# Patient Record
Sex: Male | Born: 1945 | Race: White | Hispanic: No | Marital: Married | Smoking: Never smoker
Health system: Southern US, Community
[De-identification: ages and names within clinical notes are randomized; demographics above are authoritative.]

## PROBLEM LIST (undated history)

## (undated) DIAGNOSIS — E785 Hyperlipidemia, unspecified: Secondary | ICD-10-CM

## (undated) DIAGNOSIS — R413 Other amnesia: Secondary | ICD-10-CM

## (undated) DIAGNOSIS — IMO0002 Reserved for concepts with insufficient information to code with codable children: Secondary | ICD-10-CM

## (undated) DIAGNOSIS — F32A Depression, unspecified: Secondary | ICD-10-CM

## (undated) DIAGNOSIS — R569 Unspecified convulsions: Secondary | ICD-10-CM

## (undated) DIAGNOSIS — M1990 Unspecified osteoarthritis, unspecified site: Secondary | ICD-10-CM

## (undated) DIAGNOSIS — R410 Disorientation, unspecified: Secondary | ICD-10-CM

## (undated) DIAGNOSIS — F419 Anxiety disorder, unspecified: Secondary | ICD-10-CM

## (undated) DIAGNOSIS — I10 Essential (primary) hypertension: Secondary | ICD-10-CM

## (undated) DIAGNOSIS — G4730 Sleep apnea, unspecified: Secondary | ICD-10-CM

## (undated) DIAGNOSIS — F329 Major depressive disorder, single episode, unspecified: Secondary | ICD-10-CM

## (undated) DIAGNOSIS — M549 Dorsalgia, unspecified: Secondary | ICD-10-CM

## (undated) HISTORY — DX: Unspecified osteoarthritis, unspecified site: M19.90

## (undated) HISTORY — DX: Depression, unspecified: F32.A

## (undated) HISTORY — DX: Unspecified convulsions: R56.9

## (undated) HISTORY — DX: Sleep apnea, unspecified: G47.30

## (undated) HISTORY — DX: Disorientation, unspecified: R41.0

## (undated) HISTORY — PX: ROTATOR CUFF REPAIR: SHX139

## (undated) HISTORY — DX: Depression, unspecified: F41.9

## (undated) HISTORY — DX: Dorsalgia, unspecified: M54.9

## (undated) HISTORY — PX: UVULOPALATOPHARYNGOPLASTY: SHX827

## (undated) HISTORY — DX: Major depressive disorder, single episode, unspecified: F32.9

## (undated) HISTORY — DX: Other amnesia: R41.3

## (undated) HISTORY — PX: LUMBAR SPINE SURGERY: SHX701

## (undated) HISTORY — DX: Essential (primary) hypertension: I10

## (undated) HISTORY — DX: Reserved for concepts with insufficient information to code with codable children: IMO0002

## (undated) HISTORY — PX: CERVICAL SPINE SURGERY: SHX589

## (undated) HISTORY — PX: APPENDECTOMY: SHX54

## (undated) HISTORY — PX: THORACIC SPINE SURGERY: SHX802

## (undated) HISTORY — DX: Hyperlipidemia, unspecified: E78.5

## (undated) HISTORY — PX: TOTAL KNEE ARTHROPLASTY: SHX125

---

## 1998-04-25 ENCOUNTER — Ambulatory Visit (HOSPITAL_COMMUNITY): Admission: RE | Admit: 1998-04-25 | Discharge: 1998-04-25 | Payer: Self-pay | Admitting: Rheumatology

## 1998-05-01 ENCOUNTER — Ambulatory Visit (HOSPITAL_BASED_OUTPATIENT_CLINIC_OR_DEPARTMENT_OTHER): Admission: RE | Admit: 1998-05-01 | Discharge: 1998-05-01 | Payer: Self-pay | Admitting: Orthopaedic Surgery

## 1998-06-27 ENCOUNTER — Ambulatory Visit (HOSPITAL_COMMUNITY): Admission: RE | Admit: 1998-06-27 | Discharge: 1998-06-27 | Payer: Self-pay | Admitting: Family Medicine

## 1998-09-07 ENCOUNTER — Encounter: Admission: RE | Admit: 1998-09-07 | Discharge: 1998-12-06 | Payer: Self-pay | Admitting: Anesthesiology

## 1998-12-04 ENCOUNTER — Encounter: Payer: Self-pay | Admitting: Internal Medicine

## 1998-12-04 ENCOUNTER — Emergency Department (HOSPITAL_COMMUNITY): Admission: EM | Admit: 1998-12-04 | Discharge: 1998-12-04 | Payer: Self-pay | Admitting: Emergency Medicine

## 1998-12-14 ENCOUNTER — Encounter: Admission: RE | Admit: 1998-12-14 | Discharge: 1999-03-08 | Payer: Self-pay | Admitting: Anesthesiology

## 1999-01-08 ENCOUNTER — Emergency Department (HOSPITAL_COMMUNITY): Admission: EM | Admit: 1999-01-08 | Discharge: 1999-01-08 | Payer: Self-pay | Admitting: Emergency Medicine

## 1999-03-08 ENCOUNTER — Encounter: Admission: RE | Admit: 1999-03-08 | Discharge: 1999-06-06 | Payer: Self-pay | Admitting: Anesthesiology

## 1999-06-06 ENCOUNTER — Encounter: Admission: RE | Admit: 1999-06-06 | Discharge: 1999-07-29 | Payer: Self-pay | Admitting: Anesthesiology

## 1999-07-30 ENCOUNTER — Encounter: Admission: RE | Admit: 1999-07-30 | Discharge: 1999-09-03 | Payer: Self-pay | Admitting: Anesthesiology

## 1999-09-04 ENCOUNTER — Encounter: Admission: RE | Admit: 1999-09-04 | Discharge: 1999-12-26 | Payer: Self-pay | Admitting: Anesthesiology

## 2000-01-23 ENCOUNTER — Encounter: Admission: RE | Admit: 2000-01-23 | Discharge: 2000-04-22 | Payer: Self-pay | Admitting: Anesthesiology

## 2000-05-15 ENCOUNTER — Encounter: Admission: RE | Admit: 2000-05-15 | Discharge: 2000-08-13 | Payer: Self-pay | Admitting: Anesthesiology

## 2000-06-01 ENCOUNTER — Ambulatory Visit (HOSPITAL_COMMUNITY): Admission: RE | Admit: 2000-06-01 | Discharge: 2000-06-01 | Payer: Self-pay | Admitting: *Deleted

## 2000-06-03 ENCOUNTER — Ambulatory Visit (HOSPITAL_COMMUNITY): Admission: RE | Admit: 2000-06-03 | Discharge: 2000-06-03 | Payer: Self-pay | Admitting: *Deleted

## 2000-08-17 ENCOUNTER — Encounter: Admission: RE | Admit: 2000-08-17 | Discharge: 2000-11-15 | Payer: Self-pay | Admitting: Anesthesiology

## 2000-09-18 ENCOUNTER — Ambulatory Visit (HOSPITAL_COMMUNITY): Admission: RE | Admit: 2000-09-18 | Discharge: 2000-09-18 | Payer: Self-pay | Admitting: Neurological Surgery

## 2000-09-18 ENCOUNTER — Encounter: Payer: Self-pay | Admitting: Neurological Surgery

## 2000-10-27 ENCOUNTER — Encounter: Payer: Self-pay | Admitting: Neurological Surgery

## 2000-10-29 ENCOUNTER — Encounter: Payer: Self-pay | Admitting: Neurological Surgery

## 2000-10-29 ENCOUNTER — Inpatient Hospital Stay (HOSPITAL_COMMUNITY): Admission: RE | Admit: 2000-10-29 | Discharge: 2000-10-30 | Payer: Self-pay | Admitting: Neurological Surgery

## 2000-11-03 ENCOUNTER — Encounter: Payer: Self-pay | Admitting: Neurological Surgery

## 2000-11-03 ENCOUNTER — Encounter: Admission: RE | Admit: 2000-11-03 | Discharge: 2000-11-03 | Payer: Self-pay | Admitting: Neurological Surgery

## 2000-11-27 ENCOUNTER — Encounter: Payer: Self-pay | Admitting: Neurological Surgery

## 2000-11-27 ENCOUNTER — Encounter: Admission: RE | Admit: 2000-11-27 | Discharge: 2000-11-27 | Payer: Self-pay | Admitting: Neurological Surgery

## 2000-12-18 ENCOUNTER — Encounter: Admission: RE | Admit: 2000-12-18 | Discharge: 2001-03-18 | Payer: Self-pay | Admitting: Anesthesiology

## 2001-03-17 ENCOUNTER — Encounter: Admission: RE | Admit: 2001-03-17 | Discharge: 2001-06-15 | Payer: Self-pay | Admitting: Anesthesiology

## 2001-04-20 ENCOUNTER — Encounter: Admission: RE | Admit: 2001-04-20 | Discharge: 2001-04-20 | Payer: Self-pay | Admitting: Neurological Surgery

## 2001-04-20 ENCOUNTER — Encounter: Payer: Self-pay | Admitting: Neurological Surgery

## 2001-05-13 ENCOUNTER — Inpatient Hospital Stay (HOSPITAL_COMMUNITY): Admission: RE | Admit: 2001-05-13 | Discharge: 2001-05-14 | Payer: Self-pay | Admitting: Neurological Surgery

## 2001-05-13 ENCOUNTER — Encounter: Payer: Self-pay | Admitting: Neurological Surgery

## 2001-06-04 ENCOUNTER — Encounter: Payer: Self-pay | Admitting: Neurological Surgery

## 2001-06-04 ENCOUNTER — Encounter: Admission: RE | Admit: 2001-06-04 | Discharge: 2001-06-04 | Payer: Self-pay | Admitting: Neurological Surgery

## 2001-07-15 ENCOUNTER — Encounter: Admission: RE | Admit: 2001-07-15 | Discharge: 2001-07-28 | Payer: Self-pay | Admitting: Anesthesiology

## 2001-07-16 ENCOUNTER — Encounter: Payer: Self-pay | Admitting: Neurological Surgery

## 2001-07-16 ENCOUNTER — Encounter: Admission: RE | Admit: 2001-07-16 | Discharge: 2001-07-16 | Payer: Self-pay | Admitting: Neurological Surgery

## 2001-09-09 ENCOUNTER — Encounter: Payer: Self-pay | Admitting: Family Medicine

## 2001-09-09 ENCOUNTER — Ambulatory Visit (HOSPITAL_COMMUNITY): Admission: RE | Admit: 2001-09-09 | Discharge: 2001-09-09 | Payer: Self-pay | Admitting: Family Medicine

## 2001-10-15 ENCOUNTER — Encounter: Admission: RE | Admit: 2001-10-15 | Discharge: 2001-10-15 | Payer: Self-pay | Admitting: Neurological Surgery

## 2001-10-15 ENCOUNTER — Encounter: Payer: Self-pay | Admitting: Neurological Surgery

## 2001-10-24 ENCOUNTER — Emergency Department (HOSPITAL_COMMUNITY): Admission: EM | Admit: 2001-10-24 | Discharge: 2001-10-24 | Payer: Self-pay

## 2002-05-07 ENCOUNTER — Emergency Department (HOSPITAL_COMMUNITY): Admission: EM | Admit: 2002-05-07 | Discharge: 2002-05-07 | Payer: Self-pay | Admitting: Emergency Medicine

## 2002-05-07 ENCOUNTER — Encounter: Payer: Self-pay | Admitting: Emergency Medicine

## 2002-09-07 ENCOUNTER — Encounter: Payer: Self-pay | Admitting: Emergency Medicine

## 2002-09-07 ENCOUNTER — Inpatient Hospital Stay (HOSPITAL_COMMUNITY): Admission: EM | Admit: 2002-09-07 | Discharge: 2002-09-09 | Payer: Self-pay | Admitting: Emergency Medicine

## 2003-06-26 ENCOUNTER — Ambulatory Visit (HOSPITAL_BASED_OUTPATIENT_CLINIC_OR_DEPARTMENT_OTHER): Admission: RE | Admit: 2003-06-26 | Discharge: 2003-06-26 | Payer: Self-pay | Admitting: Internal Medicine

## 2004-01-08 ENCOUNTER — Ambulatory Visit (HOSPITAL_COMMUNITY): Admission: RE | Admit: 2004-01-08 | Discharge: 2004-01-08 | Payer: Self-pay | Admitting: Anesthesiology

## 2004-01-16 ENCOUNTER — Ambulatory Visit (HOSPITAL_BASED_OUTPATIENT_CLINIC_OR_DEPARTMENT_OTHER): Admission: RE | Admit: 2004-01-16 | Discharge: 2004-01-16 | Payer: Self-pay | Admitting: Orthopaedic Surgery

## 2004-01-16 ENCOUNTER — Ambulatory Visit (HOSPITAL_COMMUNITY): Admission: RE | Admit: 2004-01-16 | Discharge: 2004-01-16 | Payer: Self-pay | Admitting: Orthopaedic Surgery

## 2004-01-19 ENCOUNTER — Emergency Department (HOSPITAL_COMMUNITY): Admission: EM | Admit: 2004-01-19 | Discharge: 2004-01-20 | Payer: Self-pay | Admitting: Emergency Medicine

## 2004-02-18 ENCOUNTER — Inpatient Hospital Stay (HOSPITAL_COMMUNITY): Admission: EM | Admit: 2004-02-18 | Discharge: 2004-02-24 | Payer: Self-pay | Admitting: Emergency Medicine

## 2004-04-12 ENCOUNTER — Encounter: Admission: RE | Admit: 2004-04-12 | Discharge: 2004-04-12 | Payer: Self-pay | Admitting: Neurological Surgery

## 2004-07-19 ENCOUNTER — Encounter: Admission: RE | Admit: 2004-07-19 | Discharge: 2004-07-19 | Payer: Self-pay | Admitting: Family Medicine

## 2004-09-13 ENCOUNTER — Ambulatory Visit (HOSPITAL_COMMUNITY): Admission: RE | Admit: 2004-09-13 | Discharge: 2004-09-13 | Payer: Self-pay | Admitting: Gastroenterology

## 2004-10-31 ENCOUNTER — Inpatient Hospital Stay (HOSPITAL_COMMUNITY): Admission: RE | Admit: 2004-10-31 | Discharge: 2004-11-04 | Payer: Self-pay | Admitting: Orthopaedic Surgery

## 2005-02-20 ENCOUNTER — Ambulatory Visit: Payer: Self-pay | Admitting: Internal Medicine

## 2006-02-17 ENCOUNTER — Ambulatory Visit: Payer: Self-pay | Admitting: Internal Medicine

## 2007-04-27 ENCOUNTER — Ambulatory Visit (HOSPITAL_COMMUNITY): Admission: RE | Admit: 2007-04-27 | Discharge: 2007-04-28 | Payer: Self-pay | Admitting: Orthopaedic Surgery

## 2007-09-10 ENCOUNTER — Ambulatory Visit: Payer: Self-pay | Admitting: Internal Medicine

## 2008-01-10 ENCOUNTER — Encounter: Payer: Self-pay | Admitting: Internal Medicine

## 2008-01-10 DIAGNOSIS — J309 Allergic rhinitis, unspecified: Secondary | ICD-10-CM | POA: Insufficient documentation

## 2008-01-10 DIAGNOSIS — G4733 Obstructive sleep apnea (adult) (pediatric): Secondary | ICD-10-CM

## 2008-01-10 DIAGNOSIS — S022XXA Fracture of nasal bones, initial encounter for closed fracture: Secondary | ICD-10-CM

## 2008-01-10 DIAGNOSIS — IMO0002 Reserved for concepts with insufficient information to code with codable children: Secondary | ICD-10-CM

## 2008-04-13 ENCOUNTER — Encounter: Admission: RE | Admit: 2008-04-13 | Discharge: 2008-04-13 | Payer: Self-pay | Admitting: Orthopedic Surgery

## 2008-06-22 ENCOUNTER — Inpatient Hospital Stay (HOSPITAL_COMMUNITY): Admission: RE | Admit: 2008-06-22 | Discharge: 2008-06-23 | Payer: Self-pay | Admitting: Neurological Surgery

## 2008-08-22 ENCOUNTER — Ambulatory Visit: Payer: Self-pay | Admitting: Internal Medicine

## 2008-08-22 DIAGNOSIS — Z77090 Contact with and (suspected) exposure to asbestos: Secondary | ICD-10-CM

## 2009-01-11 ENCOUNTER — Ambulatory Visit: Payer: Self-pay | Admitting: Internal Medicine

## 2009-03-07 ENCOUNTER — Ambulatory Visit (HOSPITAL_COMMUNITY): Admission: RE | Admit: 2009-03-07 | Discharge: 2009-03-07 | Payer: Self-pay | Admitting: Neurological Surgery

## 2009-07-19 ENCOUNTER — Ambulatory Visit: Payer: Self-pay | Admitting: Internal Medicine

## 2009-09-21 ENCOUNTER — Ambulatory Visit: Payer: Self-pay | Admitting: Internal Medicine

## 2009-10-10 ENCOUNTER — Emergency Department (HOSPITAL_COMMUNITY): Admission: EM | Admit: 2009-10-10 | Discharge: 2009-10-10 | Payer: Self-pay | Admitting: Emergency Medicine

## 2009-10-14 ENCOUNTER — Emergency Department (HOSPITAL_COMMUNITY): Admission: EM | Admit: 2009-10-14 | Discharge: 2009-10-14 | Payer: Self-pay | Admitting: Family Medicine

## 2010-04-26 ENCOUNTER — Ambulatory Visit: Payer: Self-pay | Admitting: Internal Medicine

## 2010-10-22 ENCOUNTER — Ambulatory Visit: Payer: Self-pay | Admitting: Internal Medicine

## 2011-01-28 NOTE — Assessment & Plan Note (Signed)
Summary: rov/apc   Primary Provider/Referring Provider:  Arvilla Market  CC:  follow up visit-wheezing and SOB; headache from allergies. .  History of Present Illness: 08/22/08- 65 year old man returning for follow-up of allergic rhinitis and obstructive sleep apnea.  Wife  here to help with the history.  He had cervical spine surgery with rods in April of 2009.  More comfortable now.  He has not been using CPAP.  Nose gets congested.  Occasional wheezing.  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  01/11/09-Allergic rhinitis, OSA, Hx asbestos exposure Nasal stuffiness - indoor heat. Denies sustained cough, chest pain, phlegm. Feet swell some. Runs humidifier. Gets dry skin- asks about lotion. Proair sample helped some.   07/18/09- Allergic rhinitis, OSA/ UPPP, Hx asbestos exposure.............................Marland Kitchenwife here Wife uses cpap but he failed- never tolerated. She says his breathing is moaning, heavy, more labored over past several months.He notes more wheeze, little cough. Uses Proair a bout twice/ week.  10-13-09- Allergic rihinitis, OSA/ UPPP, hx asbestos exposure Palate itching, nasal congestion past month or two. Remote hx of allergy vacine- couldn't afford then. Review PFT- WNL CXR-  Stable. Borderline CE. No apparent increase in small densities.   April 26, 2010- Allergic rhinitis, OSA/UPPP, hx asbestos exposure Increased wheeze and nasal congestion over the past few weeks. Having to sit up to breathe due to nasal congestion. Used up Nasonex but can't afford it and says it didn't seem to help. Having occipital headache with photophobia. Sleeps with cervical pilow. Failed an otc oral appliance. CPAP is too confining -"claustrophobia" so he can't tolerate it. Uses Neti pot. Wheezing and feels shallow in chest. He asks if this is related to his remote asbestos exposure.  PFT was normal last year. CXR last year showed peribvonchial  thickening but no obvious asbestos changes.  Current Medications (verified): 1)  Triazolam 0.25 Mg Tabs (Triazolam) .... Take 1 Tablet By Mouth At Bedtime 2)  Gabapentin 600 Mg  Tabs (Gabapentin) 3)  Xanax 0.5 Mg  Tabs (Alprazolam) .... Take 1 By Mouth Once Daily 4)  Lexapro 20 Mg  Tabs (Escitalopram Oxalate) 5)  Trileptal 300 Mg  Tabs (Oxcarbazepine) .... Take 1 By Mouth Two Times A Day 6)  Simvastatin 20 Mg  Tabs (Simvastatin) .... Take 1 By Mouth Once Daily 7)  Bl L-Lysine 500 Mg  Tabs (Lysine) 8)  Glucosamine 500 Mg  Caps (Glucosamine Sulfate) 9)  Multivitamins   Tabs (Multiple Vitamin) .... Take 1 By Mouth Once Daily 10)  Iron 11)  Hydrocodone-Acetaminophen 10-325 Mg  Tabs (Hydrocodone-Acetaminophen) .... As Needed 12)  Oxycontin 20 Mg Xr12h-Tab (Oxycodone Hcl) .... Take As Directed 13)  Proair Hfa 108 (90 Base) Mcg/act Aers (Albuterol Sulfate) .... 2 Puffs Four Times A Day Prn 14)  Nasonex 50 Mcg/act Susp (Mometasone Furoate) .Marland Kitchen.. 1-2 Sprays Each Nostril Daily  Allergies (verified): No Known Drug Allergies  Past History:  Past Medical History: Last updated: 07/19/2009 Allergic Rhinitis Degenerative disk disease Obstructive sleep apnea- failed cpap  Past Surgical History: Last updated: 01/11/2009 Cervical spine- rods, 3 other fusion procedures Bilateral rotator cuffs Multiple Thoracic and Lumbar spine surgeries Appendix UPPP and SMR nasal turbinates. Right TKR  Family History: Last updated: 10/13/09 Parents both died of ETOH Family hx- Arthritis Clotting disorders- nephew Mother-deceased age 56's Father-deceased age 35's Sibling 1- living age 20 Sibling 2- living age 90 Sibling 3- living age 35 Sibling 91- living age 19  Social History: Last updated: 08/28/2008  Patient never smoked.  Cut asbestos x 2 months around 1985. Positive history of passive tobacco smoke exposure.  Exercise-no Caffeine-5 cups daily Quit using Drugs- 1990 Quit  Drinking-1990 Married with no children   Risk Factors: Smoking Status: never (08/22/2008) Passive Smoke Exposure: yes (08/28/2008)  Review of Systems      See HPI       The patient complains of headaches.  The patient denies anorexia, fever, weight loss, weight gain, vision loss, decreased hearing, hoarseness, chest pain, syncope, dyspnea on exertion, peripheral edema, prolonged cough, hemoptysis, and severe indigestion/heartburn.         Occipiital headaches related to prior surgery.  Vital Signs:  Patient profile:   65 year old male Height:      70 inches Weight:      285.38 pounds BMI:     41.10 O2 Sat:      96 % on Room air Pulse rate:   76 / minute BP sitting:   140 / 70  (right arm) Cuff size:   large  Vitals Entered By: Reynaldo Minium CMA (April 26, 2010 2:08 PM)  O2 Flow:  Room air  Physical Exam  Additional Exam:  General: A/Ox3; pleasant and cooperative, NAD, obese SKIN: no rash, lesions, dry NODES: no lymphadenopathy HEENT: [place]/AT, EOM- WNL, Conjuctivae- clear, PERRLA, TM-WNL, Nose- turbinate edema, Throat- s/p UPPP NECK: Supple w/ fair ROM, JVD- none, normal carotid impulses w/o bruits Thyroid- normal to palpation CHEST: Clear to P&A HEART: RRR, no m/g/r heard, no rales or crackles ABDOMEN: Soft and nl; ZOX:WRUE, nl pulses, no edema  NEURO: Grossly intact to observation      Impression & Recommendations:  Problem # 1:  OBSTRUCTIVE SLEEP APNEA (ICD-327.23) Intolerant or unsuccessful with therapies. Weight loss would be help.  Problem # 2:  HISTORY OF ASBESTOS EXPOSURE (ICD-V15.84) PFT and CXR don't show obvious asbestos changes  He would like ventolin HFA for trial  Problem # 3:  ALLERGIC RHINITIS (ICD-477.9)  We will try neb and depo for effect. Change to fluticasone if he can afford it. His updated medication list for this problem includes:    Fluticasone Propionate 50 Mcg/act Susp (Fluticasone propionate) .Marland Kitchen... 2 sprays each nostril once daily  at bedtime  Orders: EMR miscellaneous medications (EMRORAL) Nebulizer Tx (45409) Depo- Medrol 80mg  (J1040) Admin of Therapeutic Inj  intramuscular or subcutaneous (81191)  Medications Added to Medication List This Visit: 1)  Fluticasone Propionate 50 Mcg/act Susp (Fluticasone propionate) .... 2 sprays each nostril once daily at bedtime 2)  Ventolin Hfa 108 (90 Base) Mcg/act Aers (Albuterol sulfate) .... 2 puffs four times a day as needed rescue inhaler  Patient Instructions: 1)  Please schedule a follow-up appointment in 6 months. 2)  neb neo nasal 3)  depo 80 4)  Script for fluticasone to try instead of nasonex 5)  script for Ventolin rescue inhaler to try instead of Proair Prescriptions: VENTOLIN HFA 108 (90 BASE) MCG/ACT AERS (ALBUTEROL SULFATE) 2 puffs four times a day as needed rescue inhaler  #1 x prn   Entered and Authorized by:   Waymon Budge MD   Signed by:   Waymon Budge MD on 04/26/2010   Method used:   Print then Give to Patient   RxID:   [id number] FLUTICASONE PROPIONATE 50 MCG/ACT SUSP (FLUTICASONE PROPIONATE) 2 sprays each nostril once daily at bedtime  #1 x prn   Entered and Authorized by:   Waymon Budge MD   Signed by:  Waymon Budge MD on 04/26/2010   Method used:   Print then Give to Patient   RxID:   [id number]      Medication Administration  Injection # 1:    Medication: Depo- Medrol 80mg     Diagnosis: ALLERGIC RHINITIS (ICD-477.9)    Route: IM    Site: LUOQ gluteus    Exp Date: 12/2010    Lot #: [id number]    Mfr: Pharmacia    Patient tolerated injection without complications    Given by: Zackery Barefoot CMA (April 26, 2010 2:59 PM)  Medication # 1:    Medication: EMR miscellaneous medications    Diagnosis: ALLERGIC RHINITIS (ICD-477.9)    Dose: 3 drops    Route: inhaled    Exp Date: 05/12    Lot #: [id number]    Mfr: Bayer    Patient tolerated medication without complications    Given by: Zackery Barefoot CMA (April 26, 2010 2:58 PM)  Orders Added: 1)  EMR miscellaneous medications [EMRORAL] 2)  Nebulizer Tx [62130] 3)  Depo- Medrol 80mg  [J1040] 4)  Admin of Therapeutic Inj  intramuscular or subcutaneous [86578]

## 2011-01-28 NOTE — Assessment & Plan Note (Signed)
Summary: 6 months/ mbw   Primary Provider/Referring Provider:  Arvilla Market  CC:  6 month follow up, c/o increase allergy symptons, nasal congestion, headaches, and prod cough yellow.  History of Present Illness:  09/23/2009- Allergic rihinitis, OSA/ UPPP, hx asbestos exposure Palate itching, nasal congestion past month or two. Remote hx of allergy vacine- couldn't afford then. Review PFT- WNL CXR-  Stable. Borderline CE. No apparent increase in small densities.  April 26, 2010- Allergic rhinitis, OSA/UPPP, hx asbestos exposure Increased wheeze and nasal congestion over the past few weeks. Having to sit up to breathe due to nasal congestion. Used up Nasonex but can't afford it and says it didn't seem to help. Having occipital headache with photophobia. Sleeps with cervical pilow. Failed an otc oral appliance. CPAP is too confining -"claustrophobia" so he can't tolerate it. Uses Neti pot. Wheezing and feels shallow in chest. He asks if this is related to his remote asbestos exposure.  PFT was normal last year. CXR last year showed peribvonchial thickening but no obvious asbestos changes.  October 22, 2010- Allergic rhinitis, OSA/UPPP, hx asbestos exposure Nurse-CC: 6 month follow up, c/o increase allergy symptoms, nasal congestion, headaches, prod cough yellow Tries to use Neti pot, but it doesn't help much. He is using Afrin or Dristan about 3x/ daily although I have counseled against it. This was discussed agian. He "panics" if he can't breathe. Ventolin works well , used about 3x/ week  Declines flu vax- says it made him sick last year.     Preventive Screening-Counseling & Management  Alcohol-Tobacco     Smoking Status: never  Current Medications (verified): 1)  Gabapentin 600 Mg  Tabs (Gabapentin) 2)  Xanax 0.5 Mg  Tabs (Alprazolam) .... Take 1 By Mouth Once Daily 3)  Lexapro 20 Mg  Tabs (Escitalopram Oxalate) 4)  Trileptal 300 Mg  Tabs (Oxcarbazepine) .... Take 1 By Mouth Two Times  A Day 5)  Simvastatin 20 Mg  Tabs (Simvastatin) .... Take 1 By Mouth Once Daily 6)  Bl L-Lysine 500 Mg  Tabs (Lysine) 7)  Glucosamine 500 Mg  Caps (Glucosamine Sulfate) 8)  Multivitamins   Tabs (Multiple Vitamin) .... Take 1 By Mouth Once Daily 9)  Iron 10)  Hydrocodone-Acetaminophen 10-325 Mg  Tabs (Hydrocodone-Acetaminophen) .... As Needed 11)  Oxycontin 20 Mg Xr12h-Tab (Oxycodone Hcl) .... Take As Directed 12)  Fluticasone Propionate 50 Mcg/act Susp (Fluticasone Propionate) .... 2 Sprays Each Nostril Once Daily At Bedtime 13)  Ventolin Hfa 108 (90 Base) Mcg/act Aers (Albuterol Sulfate) .... 2 Puffs Four Times A Day As Needed Rescue Inhaler  Allergies (verified): No Known Drug Allergies  Past History:  Past Medical History: Last updated: 07/19/2009 Allergic Rhinitis Degenerative disk disease Obstructive sleep apnea- failed cpap  Past Surgical History: Last updated: 01/11/2009 Cervical spine- rods, 3 other fusion procedures Bilateral rotator cuffs Multiple Thoracic and Lumbar spine surgeries Appendix UPPP and SMR nasal turbinates. Right TKR  Family History: Last updated: Sep 23, 2009 Parents both died of ETOH Family hx- Arthritis Clotting disorders- nephew Mother-deceased age 73's Father-deceased age 48's Sibling 1- living age 47 Sibling 2- living age 53 Sibling 3- living age 63 Sibling 69- living age 67  Social History: Last updated: 08/28/2008 Patient never smoked.  Cut asbestos x 2 months around 1985. Positive history of passive tobacco smoke exposure.  Exercise-no Caffeine-5 cups daily Quit using Drugs- 1990 Quit Drinking-1990 Married with no children   Risk Factors: Smoking Status: never (10/22/2010) Passive Smoke Exposure: yes (08/28/2008)  Review of Systems  See HPI       The patient complains of nasal congestion/difficulty breathing through nose.  The patient denies shortness of breath with activity, shortness of breath at rest, productive cough,  non-productive cough, coughing up blood, chest pain, irregular heartbeats, acid heartburn, indigestion, loss of appetite, weight change, abdominal pain, difficulty swallowing, sore throat, tooth/dental problems, headaches, and sneezing.    Vital Signs:  Patient profile:   65 year old male Height:      69 inches Weight:      272.4 pounds BMI:     40.37 O2 Sat:      95 % on Room air Pulse rate:   64 / minute BP sitting:   148 / 70  (left arm) Cuff size:   large  Vitals Entered By: Renold Genta RCP, LPN (October 22, 2010 10:58 AM)  O2 Flow:  Room air CC: 6 month follow up, c/o increase allergy symptons, nasal congestion, headaches, prod cough yellow Is Patient Diabetic? No Comments Medications reviewed with patient Renold Genta RCP, LPN  October 22, 2010 10:58 AM    Physical Exam  Additional Exam:  General: A/Ox3; pleasant and cooperative, NAD, obese SKIN: no rash, lesions, dry NODES: no lymphadenopathy HEENT: [place]/AT, EOM- WNL, Conjuctivae- clear, PERRLA, TM-WNL, Nose- turbinate edema, Throat- s/p UPPP, not red, no drainage NECK: Supple w/ fair ROM, JVD- none, normal carotid impulses w/o bruits Thyroid- normal to palpation CHEST: Clear to P&A, no rales or crackles HEART: RRR, no m/g/r heard, ABDOMEN: Soft and nl; ZOX:WRUE, nl pulses, no edema  NEURO: Grossly intact to observation      Impression & Recommendations:  Problem # 1:  ALLERGIC RHINITIS (ICD-477.9)  He is getting in risk of rhinitis medicamentosa. We discussed this very carefully. I will have him try an antihistamine nasal spray.   His updated medication list for this problem includes:    Fluticasone Propionate 50 Mcg/act Susp (Fluticasone propionate) .Marland Kitchen... 2 sprays each nostril once daily at bedtime    Afrin Nasal Spray 0.05 % Soln (Oxymetazoline hcl) .Marland Kitchen... 2 sprays each nostri three times a day  Problem # 2:  OBSTRUCTIVE SLEEP APNEA (ICD-327.23)  s/p UPPP. This seems to be controlling him.  Problem #  3:  HISTORY OF ASBESTOS EXPOSURE (ICD-V15.84) We have educated on long term concerns, mainly some increased risk of cancer.  Medications Added to Medication List This Visit: 1)  Afrin Nasal Spray 0.05 % Soln (Oxymetazoline hcl) .... 2 sprays each nostri three times a day  Other Orders: Est. Patient Level III (45409)  Patient Instructions: 1)  Please schedule a follow-up appointment in 6 months. 2)  Try sample Astepro nasal antihistamine spray 3)   1-2 puffs in each nostril twice daily if needed.

## 2011-04-21 ENCOUNTER — Encounter: Payer: Self-pay | Admitting: Internal Medicine

## 2011-04-22 ENCOUNTER — Ambulatory Visit
Admission: RE | Admit: 2011-04-22 | Discharge: 2011-04-22 | Disposition: A | Discharge status: Home / Self Care | Payer: Medicare Other | Source: Ambulatory Visit | Attending: Anesthesiology | Admitting: Anesthesiology

## 2011-04-22 ENCOUNTER — Other Ambulatory Visit: Payer: Self-pay | Admitting: Anesthesiology

## 2011-04-22 DIAGNOSIS — M545 Low back pain: Secondary | ICD-10-CM

## 2011-04-23 ENCOUNTER — Ambulatory Visit: Payer: Self-pay | Admitting: Internal Medicine

## 2011-05-13 NOTE — Discharge Summary (Signed)
NAME:  [name], [name] NO.:  [id number]   MEDICAL RECORD NO.:  [id number]          PATIENT TYPE:  INP   LOCATION:  3109                         FACILITY:  [facility]   PHYSICIAN:  Stefani Dama, M.D.  DATE OF BIRTH:  [birth year]   DATE OF ADMISSION:  06/22/2008  DATE OF DISCHARGE:  06/23/2008                               DISCHARGE SUMMARY   ADMITTING DIAGNOSES:  Cervical spondylosis with pseudoarthrosis C3-C4,  C5-C6, cervical radiculopathy C6-C7.   DISCHARGE AND FINAL DIAGNOSES:  Cervical spondylosis with  pseudoarthrosis C3-C4, C5-C6, cervical radiculopathy C6-C7.   MAJOR OPERATIONS:  Decompression of C6-C7 bilaterally, segmental  fixation C3 to C7 with posterolateral arthrodesis using allograft, and  infused on June 22, 2008.   CONDITION ON DISCHARGE:  Improving.   HOSPITAL COURSE:  [name] is a 64 year old individual who was had  significant problems with neck, shoulder, and arm pain.  He has evidence  of  pseudoarthrosis at C3-C4 and C5-C6 with a solid fusion at C4-C5.  He  has evidence of advanced spondylitic changes at C6-C7 with foraminal  stenosis.  He has been advised regarding need for surgery to decompress  C6-C7 and stabilize posteriorly from C3 to C7.  He was taken to the  operating room where this procedure was performed, and postoperatively  he has done well, tolerating oral pain medications.  He is currently on  OxyContin 20 mg three times a day in addition to Norco 10 mg q.6 h. as  needed for pain.  He is discharged to home on these medications in  addition to Valium for muscle spasm.  He will be seen in the office in  the next 2 or 3 days for postoperative care.   CONDITION ON DISCHARGE:  Improved.      Stefani Dama, M.D.  Electronically Signed     HJE/MEDQ  D:  06/23/2008  T:  06/24/2008  Job:  [id number]

## 2011-05-13 NOTE — Op Note (Signed)
NAME:  [name] NO.:  [id number]   MEDICAL RECORD NO.:  [id number]          PATIENT TYPE:  INP   LOCATION:  3109                         FACILITY:  [facility]   PHYSICIAN:  [name], M.D.  DATE OF BIRTH:  22-Aug-1946   DATE OF PROCEDURE:  DATE OF DISCHARGE:                               OPERATIVE REPORT   PREOPERATIVE DIAGNOSIS:  Pseudoarthrosis C3-C4, C5-C6 spondylosis, C6-C7  with cervical radiculopathy.   POSTOPERATIVE DIAGNOSIS:  Pseudoarthrosis C3-C4, C5-C6 spondylosis, C6-  C7 with cervical radiculopathy.   PROCEDURE:  Posterior decompression C6-C7, decompression of C7 nerve  root and common dural tube, segmental fixation C3-C7 with facet screws  and rod and posterior and posterolateral arthrodesis using allograft.   SURGEON:  [name], M.D.   FIRST ASSISTANT:  Clydene Fake, M.D.   ANESTHESIA:  General endotracheal.   INDICATIONS:  [name] is a 65 year old individual who has had  significant problems with neck pain, shoulder, and arm pain.  He has  some ulnar dysesthesias and has evidence of spondylosis at the C6-C7  level.  He is advised regarding the need for surgical decompression via  posterior approach secondary to his previously failed anterior fusions  at C3-C4 and C5-C6.   PROCEDURE:  The patient was brought to the operating room supine on the  stretcher.  After smooth induction of general endotracheal anesthesia,  he was placed in the three-pin headrest, then turned prone carefully.  Neck was supported in the three-pin headrest and the back of the neck  was then prepped with alcohol and DuraPrep and draped in a sterile  fashion.  A midline incision was created and carried down to the  cervical dorsal fascia which was opened on either side of midline to  expose the spinous processes from C2 down to T1.  Dissection was then  carried out in subperiosteal fashion out to the lateral gutters with a  self-retaining  McCullough retractors being placed deep in the wound at  C6-C7, then bilateral laminotomies were created with foraminotomies to  expose the C7 nerve roots.  Path of the C7 nerve roots were then  skeletonized out into the foramen, these were decompressed.  Significant  redundant thickened ligamentous material was present over the  laminotomies and once the path of the C7 nerve root was identified and  cleared, the central canal portion was then cleared similarly.  Hemostasis in the soft tissues was achieved and then the transfacet  screws were chosen at C3, C4, C5, C6, and C7.  On the left side then,  facet screws measuring 14 mm in length and 4 mm in diameter were placed  into the facets using standard Roy-Camille technique.  On the right  side, the facet screws replaced in C3, C4, C6, and C7.  C5 initially had  a hole placed, but the screw was noted to strip out laterally, and for  that reason, the screw was not placed there.  Then, an 80-mm rod was cut  and contoured to fit between the screw heads.  It was tightened in place  in a neutral  position.  The screw caps being applied.  System was  torqued down in a neutral position.   Finally, we obtained radiographs in the AP and lateral projections.  Radiology was difficult to obtain adequately secondary to this patient's  robust shoulders and thick neck.  In any event, visual exploration was  undertaken to make sure that there was no cutout of the screws and each  screw was sounded individually after the hole was tapped and no cutout  was noted.  With the hardware being in place, the lateral gutters and  the medial laminar arches were decorticated using a high-speed drill and  strips of Infuse were laid between C3 and C7 along with allograft in the  form of Vitoss bone sponge that was cut into 4 long strips, with the  strip each being placed into the lateral gutter and to the interlaminar  space on each side.  With this, cervical dorsal  fascia was  reapproximated and closed with #1 Vicryl interrupted fashion, 2-0 Vicryl  was used in subcutaneous tissues, 3-0 Vicryl was used subcuticularly and  surgical staples were used on the skin.  Total blood loss for the  procedure was estimated at approximately 600 mL.  The patient tolerated  procedure well and was returned to recovery room in stable condition.      [name], M.D.  Electronically Signed     HJE/MEDQ  D:  06/22/2008  T:  06/23/2008  Job:  [id number]

## 2011-05-13 NOTE — Assessment & Plan Note (Signed)
[facility]                             PULMONARY OFFICE NOTE   [name], [name]                      MRN:          [id number]  DATE:09/10/2007                            DOB:          16-Feb-1946    PROBLEM:  1. Obstructive sleep apnea/palatoplasty.  2. Allergic rhinitis.   HISTORY:  [name] had dropped off of CPAP quite some time ago with a more  distant history of palatoplasty for his sleep apnea.  Wife indicates [name]  is snoring again as [name] has gained weight.  Bedtime is around 11 p.m., up  at 6 a.m., then [name] will go back to bed often until 8 a.m. after [name] has  gotten his wife off to work.  [name] feels some difficulty taking a deep  breath, but denies wheeze or cough.  Shoulder pain wakes him frequently.  Dr. Vear Clock is having him try Xanax 0.5 mg which may contribute to  daytime somnolence.  [name] has a history of nasal fracture.   MEDICATIONS:  1. Gabapentin 600 mg .  2. Xanax 0.5 mg b.i.d.  3. Lexapro 20 mg.  4. Trileptal 300 mg b.i.d.  5. Simvastatin 20 mg.  6. L-lycine.  7. Glucosamine.  8. Multivitamins.  9. Iron.  10.Hydrocodone 10/325 q.i.d.   ALLERGIES:  No medication allergy.   OBJECTIVE:  Weight 267 pounds, blood pressure 146/72, pulse 66, room air  saturation 98%.  [name] seems alert now.  [name] can breathe comfortably through his nose with  mouth closed.  NECK:  There is no stridor or neck vein distention.  CHEST:  Clear.  Pulse regular, no tremor.   IMPRESSION:  1. Obstructive sleep apnea, now decompensating if [name] gains weight.  2. History of nasal fracture.  3. Behavioral health problems with use of sedating medications  4. Sleep disturbed by somatic pain related to his shoulder.   PLAN:  1. Weight loss which is the main problem decompensating his sleep      apnea.  2. Stop triazolam since [name] is taking of the sedating medicines.  3. Schedule return four months.  If [name] has not managed to lose weight      and see significant   improvement we may need to repeat a sleep study.  4. We discussed good sleep hygiene and reasonable expectations.     Clinton D. Maple Hudson, MD, Tonny Bollman, FACP  Electronically Signed    CDY/MedQ  DD: 09/12/2007  DT: 09/13/2007  Job #: [id number]   cc:   Donia Guiles, M.D.  Kathrin Penner. Vear Clock, M.D.

## 2011-05-16 NOTE — Op Note (Signed)
[place]  Patient:    [name], [name]                      MRN: [id number] Proc. Date: 10/29/00 Adm. 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  OPERATION:  Anterior cervical diskectomy and arthrodesis, C3-4; Synthes fixation, structural allograft.  SURGEON:  Stefani Dama, M.D.  FIRST ASSISTANT:  Tanya Nones. Jeral Fruit, M.D.  ANESTHESIA:  General endotracheal.  INDICATIONS:  The patient is a 65 year old individual who has had significant neck, shoulder, bilateral arm pain with headaches.  He has had previous spondylitic disease in the cervical spine but now has demonstrated cord compression at the C3-4 level with myopathic changes.  He was advised regarding surgical decompression and stabilization at the C3-4 level via an anterior diskectomy.  DESCRIPTION OF PROCEDURE:  The patient was brought to the operating room and placed on the table in the supine position.  After the smooth induction of general endotracheal anesthesia, 5 pounds of Holter traction was placed.  The neck was prepped with DuraPrep and draped in a sterile fashion.  Transverse incision was made in the neck on the left side and carried down through the platysma.  The plane between the sternocleidomastoid and the strap muscles was dissected bluntly until the first prevertebral space was reached.  This was identified as C3-4 on the radiograph.  After placing a self-retaining Caspar retractor, the interspace at C3-C4 was clear using a combination of curets and rongeurs and the Leksell rongeur was used to remove a large ventral osteophyte.  The disk space was emptied completely.  The lateral uncinate processes were noted to be quite hypertrophied and these were ground down with the Midas Rex and A2 bur.  The  inferior margin of the body of C3 formed a significant posterior spur, and this was taken down similarly with the Midas Rex and then the 2 and 3 mm Kerrison punch was used to remove remaining bony osteophyte and overgrown ligament in this region, decompressing down to the dura.  The lateral recesses were well-decompressed.  Hemostasis was obtained with the bipolar cautery and some small pledgets of Gelfoam soaked in thrombin.  Then with a disk spreader on one side a 7 mm round fibular graft was placed into the interspace.  Traction was removed.  The neck was then checked for overall alignment.  An 18 mm Synthes plate was fixed with locking 4 x 16 mm screws which were self-drilling and self-tapping.  Locking screws were placed.  The area was checked for hemostasis.  Localizing x-ray identified good position of the bone graft and anterior plate fixation.  The platysma was then closed with 2-0 Vicryl in an interrupted fashion.  Then 3-0 Vicryl was used subcuticularly.  A clear plastic dressing was placed on the skin, and the patient tolerated the procedure well and was returned to the recovery room in stable condition. DD:  10/29/00 TD:  10/29/00 Job: [id number]

## 2011-05-16 NOTE — Op Note (Signed)
NAME:  [name], [name]                         ACCOUNT NO.:  [id number]   MEDICAL RECORD NO.:  [id number]                   PATIENT TYPE:  AMB   LOCATION:  ENDO                                 FACILITY:  [facility]   PHYSICIAN:  James L. Malon Kindle., M.D.          DATE OF BIRTH:  [birth year]   DATE OF PROCEDURE:  09/13/2004  DATE OF DISCHARGE:                                 OPERATIVE REPORT   PROCEDURE:  Colonoscopy.   MEDICATIONS:  Fentanyl 150 mcg, Versed 2 mg IV.   INDICATIONS:  Previous history of adenomatous polyps.   DESCRIPTION OF PROCEDURE:  The procedure had been explained and patient  consent obtained.  With the patient in the left lateral decubitus position,  the Olympus pediatric adjustable scope was inserted and advanced.  The prep  was quite poor.  There was sticky adherent stool throughout the colon.  Able  to advance easily to the cecum.  The cecum in particular was dirty but no  gross lesions were seen as we withdrew from the colon.  The ascending colon,  transverse colon, splenic flexure, and descending colon were seen well.  It  was irrigated.  There were some spots that were obscured by solid stool.  The scope was withdrawn.  The patient tolerated the procedure well.   ASSESSMENT:  No evidence of further polyps but a poor prep could not rule  out a small polyp.  V12.72.   PLAN:  Will recommend repeating the procedure in three years with a NuLytely  prep and mag citrate.      JLE/MEDQ  D:  09/13/2004  T:  09/13/2004  Job:  [id number]   cc:   Donia Guiles, M.D.  [address] [place]  [place] [address]  Fax: [phone]

## 2011-05-16 NOTE — Procedures (Signed)
[facility]  Patient:    [name], [name]                      MRN: [id number] Proc. Date: 08/17/00 Adm. Date:  [id number] Attending:  Thyra Breed CC:         Desma Maxim, M.D.  Candy Sledge, M.D.   Procedure Report  PROCEDURE:  Lumbar epidural steroid injection.  DIAGNOSIS:  Lumbar spondylosis with bilateral lower extremity pain and back pain.  INTERVAL HISTORY:  The patients noted that his pain is beginning to reoccur to an extent and feels as though he got such a good response to epidurals back in late February, early March so he would like to go ahead and start these again. We discussed this a couple weeks ago, and he is scheduled now for epidurals today. His symptoms are a little changed but have just intensified in severity. He rates his pain at 5/10. He continues on the Norco for pain control.  PHYSICAL EXAMINATION:  VITAL SIGNS:  Blood pressure 106/66, heart rate 66, respiratory rate 12, O2 saturations 98% and pain level is 5/10 and temperature is 97.4.  NEUROLOGIC:  Grossly unchanged.  DESCRIPTION OF PROCEDURE:  After informed consent was obtained, the patient was placed in the sitting position and monitored. The patients back was prepped with Betadine x 3. A skin wheal was raised at the L4-5 interspace with 1 percent lidocaine. A 20 gauge Tuohy needle was introduced to the lumbar epidural space to loss of resistance to preservative free normal saline. There was no cerebrospinal fluid nor blood. 120 mg of Medrol and 10 ml of preservative free normal saline was very slowly injected. The needle was flushed with preservative free normal saline and removed intact.  CONDITION POST PROCEDURE:  Stable.  DISCHARGE INSTRUCTIONS:  Resume previous diet. Limitations in activities per instruction sheet. Continue on current medications. The patient is to follow-up in 1-2 weeks for repeat injection. DD:  08/17/00 TD:  08/17/00 Job:  [id number]

## 2011-05-16 NOTE — Discharge Summary (Signed)
NAME:  [name], BURROUGHS NO.:  [id number]   MEDICAL RECORD NO.:  [id number]          PATIENT TYPE:  INP   LOCATION:  5032                         FACILITY:  [facility]   PHYSICIAN:  Lubertha Basque. Dalldorf, M.D.DATE OF BIRTH:  02/10/1946   DATE OF ADMISSION:  10/31/2004  DATE OF DISCHARGE:  11/04/2004                                 DISCHARGE SUMMARY   ADMISSION DIAGNOSES:  1.  Right knee end-stage degenerative joint disease.  2.  History of hypertension.  3.  History of alcohol abuse.   DISCHARGE DIAGNOSES:  1.  Right knee end-stage degenerative joint disease.  2.  History of hypertension.  3.  History of alcohol abuse.   PROCEDURE:  Right total knee replacement.   HISTORY OF PRESENT ILLNESS:  The patient is a 65 year old white male and is  a patient well-known to our practice.  He has had persistent right knee pain  and is now having pain with every step and some trouble sleeping at  nighttime.  He has had arthroscopy of his knee a number of years ago and at  that time it was noted at surgery that he had significant end-stage DJD  changes.  He had failed oral anti-inflammatory medicines, injectable  medications including Synvisc and corticosteroid injections.  His x-rays  revealed bone-on-bone DJD and we had discussed treatment options with him.  That being total knee replacement and the risks of anesthesia, infection,  DVT, PE, and possible death.   LABORATORY DATA:  Last INR was 2.0, RBC 3.61, hemoglobin 11.4, hematocrit  32.6, glucose 110, potassium 3.3, sodium 136, BUN 7, creatinine 0.8, calcium  8.4.   Chest x-ray; no active disease.   HOSPITAL COURSE:  Postoperatively, he was placed on a PCA morphine pump, IV  lactated Ringers 100 mL an hour, and three doses of Ancef one q.8h x3,  Coumadin and Lovenox per pharmacy protocol for DVT prophylaxis, Percocet for  pain, Phenergan for an antiemetic.  He was kept on his home medications;  Neurontin, Flomax, HCTZ,  Triazolam, Serzone, Xanax, and also given Scolaxin  as needed for spasm.  DVT prophylaxis included the Coumadin and Lovenox as  well as knee-high TED's.  Also we had encouraged incentive spirometry every  hour.  He had a bed with an overhead frame, knee immobilizer on his right  leg when he was up and walking, could be off at nighttime.  He was on a CPM  machine starting off at 0 to 50 six to eight hours a day and then advanced  as tolerated.  Physical therapy was ordered for weightbearing as tolerated  and he had lab work drawn two days postoperatively as described above.  His  first postoperative day, his vital signs were stable.  PCA was keeping him  comfortable.  Foley catheter was in place.  Lungs were clear.  Cardiac; S1  and S2.  Abdomen soft.  His INR was 1.1.  He was afebrile.  His wound showed  no sign of infection and he had a drain in place.  He was progressing well.  The second day postoperative, physical  therapy was working with him to do  weightbearing as tolerated.  Pharmacy was regulating his anticoagulation.  His vital signs remained stable.  He was afebrile.  Blood pressure 139/66,  temperature 97, and lungs continued to be clear.  Abdomen was soft.  He was  eating and voiding after his catheter was removed with no problems and  working with physical therapy, also using CPM machine.  On November 7, he  was discharged home.   CONDITION ON DISCHARGE:  Improved.  His dressing was changed prior to  leaving the hospital.   DISCHARGE MEDICATIONS:  1.  He will remain on Coumadin dose to be filled in on the prescription pad      by the pharmacist when they see him prior to discharge today.  2.  Neurontin 600 mg three times a day.  3.  Flomax 0.4 daily.  4.  HCTZ 25 mg daily.  5.  Xanax 0.5 one pill two times a day.  6.  Serzone 300 mg a day.  7.  Percocet 5/325 one or two every four to six hours p.r.n. for pain.   ACTIVITY:  He is out of work.  He is weightbearing as  tolerated.  May change  the dressing daily and may shower.  Physical therapy is provided by [place]  at [phone] as well as nursing care and home pro-times as well as a home CPM  machine.   FOLLOW UP:  He will return to our office in one week and is to call [phone]  for an appointment and will also call that same number if there is any sign  of infection, increasing redness or drainage, increasing pain.  I have  discussed this with the patient in detail today.       MC/MEDQ  D:  11/04/2004  T:  11/04/2004  Job:  [id number]

## 2011-05-16 NOTE — H&P (Signed)
NAME:  [name], SOM                         ACCOUNT NO.:  [id number]   MEDICAL RECORD NO.:  [id number]                   PATIENT TYPE:  INP   LOCATION:  3007                                 FACILITY:  [facility]   PHYSICIAN:  Corinna L. Lendell Caprice, MD             DATE OF BIRTH:  11-18-1946   DATE OF ADMISSION:  02/17/2004  DATE OF DISCHARGE:                                HISTORY & PHYSICAL   CHIEF COMPLAINT:  Weakness, diarrhea, abdominal pain.   HISTORY OF PRESENT ILLNESS:  [name] is a 65 year old white male who has  been on several courses of antibiotics recently who presented to the  emergency room with anorexia, weakness, watery diarrhea, abdominal pain,  fevers to 101.  This has been going on for the past week or so.  He has  recently been on a course of Keflex for a leg cellulitis after cutting  himself on a broken board, and also he apparently developed some redness and  what sounds like cellulitis in the left arm after undergoing left shoulder  surgery by Dr. Marcene Corning.  The patient reports that he presented to the  emergency room and had taken a 10-day course of Keflex.  Also recently he  has had scrotal swelling, redness, and pain and was given a course of Cipro  starting about 4 days ago.  He was seen by Dr. __________ Valentina Lucks in the  office and told that the sac was infected.  He denies hematochezia.  He  denies vomiting, but he has really not eaten much over the past week.  Also,  he was recently started on hydrochlorothiazide.  He has a history of upper  GI bleed secondary to nonsteroidal anti-inflammatories.  He also has a  history of a colon polyp but otherwise has had no previous episodes of this.  He is status post appendectomy many years ago.   PAST MEDICAL HISTORY:  1. Recent left shoulder acromioplasty, acromioclavicular resection, and open     rotator cuff tear with debridement of biceps by Dr. Marcene Corning.  He     was supposed to follow up last week  but was too ill and missed his     appointment.  He feels that his pain has worsened since surgery, and he     is worried that he may have done something to his shoulder.  2. Chronic back pain with multiple epidural steroid injections and multiple     surgeries.  3. Hypertension.  4. Depression.  5. Seasonal allergic rhinitis.  6. Recent left leg and arm cellulitis.  7. Scrotal cellulitis, being treated with Cipro currently.  8. Obstructive sleep apnea, status post palatoplasty, intolerant of CPAP.   The patient's pain management specialist is Dr. Thyra Breed.   SOCIAL HISTORY:  The patient is married and is here with his supportive  wife.  He does not smoke.  He is a recovering alcoholic.  He denies illicit  drug use history.  He is disabled from his spine problems.   FAMILY HISTORY:  His parents both died of alcohol-related problems.   PAST SURGICAL HISTORY:  1. He has had multiple neck surgeries.  2. He has had recent shoulder surgery.  3. He has had an appendectomy.  4. He has had a palatoplasty for obstructive sleep apnea.   REVIEW OF SYSTEMS:  Systems review is as HPI, otherwise negative.   PHYSICAL EXAMINATION:  VITAL SIGNS:  His blood pressure initially was 84/58  and dropped to a low of 80/58.  With IV fluids it has increased to 94/58.  Heart rate is ranging 80-100, respiratory rate 18, oxygen saturation 93% on  room air.  His temperature is 101.9.  GENERAL:  Well-nourished, well-developed, in no acute distress.  HEENT:  Normocephalic, atraumatic.  Pupils are equal, round, and reactive to  light.  Sclerae nonicteric.  He has moist mucous membranes and evidence of  previous palatoplasty.  NECK:  Supple.  No lymphadenopathy.  No JVD.  LUNGS:  Clear to auscultation bilaterally.  Without wheezes, rhonchi, or  rales.  CARDIOVASCULAR:  Regular rate and rhythm.  Without murmurs, gallops, or  rubs.  ABDOMEN:  Normal bowel sounds.  He has mild diffuse tenderness.  No  rebound  tenderness.  GENITOURINARY:  He has a lot of scrotal swelling with erythema.  He has  minimal tenderness of the testes and epididymis.  I feel no discrete mass.  RECTAL:  Heme-positive trace.  EXTREMITIES:  His left shoulder is nontender.  There is no evidence of  cellulitis.  His left pretibial area has a healing abrasion.  No evidence of  cellulitis.  PSYCHIATRIC:  Flat affect.  NEUROLOGIC:  Alert and oriented.  Cranial nerves and sensory motor exam are  intact grossly.  SKIN:  As above.   LABORATORIES:  White blood cell count is 14,000 with 83% neutrophils and  greater than 20% bands with toxic granulation.  His hemoglobin is 12.4,  hematocrit is 36, platelet count 265.  Sodium is 122, potassium 3, chloride  88, bicarbonate 24, glucose 106.  Albumin is 2.2, calcium is 7.4, total  protein 5.4, otherwise his LFTs are unremarkable.  UA negative.   CT scan of the abdomen shows pancolitis.   ASSESSMENT AND PLAN:  1. Pancolitis, probably pseudomembranous given his several courses of     antibiotics and the appearance of the CT scan:  His stool has been sent     off for Clostridium difficile, enteric pathogens, and he has received a     dose of Cipro and Flagyl.  I will continue these antibiotics pending     further studies.  He will be started on a clear-liquid diet for now.  2. Scrotal cellulitis:  If this does not improve, consider urology consult     with Dr. Boston Service.  3. Recent left acromioplasty and rotator cuff tear:  I will notify Dr. Marcene Corning of the patient's admission to see whether he wants to follow up     while the patient is hospitalized.  4. Hyponatremia secondary to dehydration, diarrhea, and hydrochlorothiazide:     He will get IV fluids.  This will be monitored, and his     hydrochlorothiazide will be held.  5. Hyponatremia secondary to above:  This will be repleted intravenously. 6. Transient hypotension secondary to dehydration:  The  patient has received     almost 3 L  of normal saline and may safely be admitted to the floor.  7. Hypertension:  See above.  8. Chronic back pain:  I will continue his medications.  9. Depression:  Continue medications.  10.      Dehydration:  See above.  11.      History of upper gastrointestinal bleed secondary to nonsteroidal     anti-inflammatories:  Despite his heme-positive stool, the patient does     not appear to be actively hemorrhaging.  I will, however, give IV Pepcid     for now.                                                Corinna L. Lendell Caprice, MD    CLS/MEDQ  D:  02/18/2004  T:  02/18/2004  Job:  [id number]   cc:   Donia Guiles, M.D.  [address] [place]  [place] [address]  Fax: [phone]   Boston Service, M.D.  [address]  [place]  [place] [address]  Fax: [phone]   Lubertha Basque. Jerl Santos, M.D.  [address]  [place]  [place] [address]  Fax: [phone]   Kathrin Penner. Vear Clock, M.D.  [address]  [place]  [place] [address]  Fax: [phone]

## 2011-05-16 NOTE — Procedures (Signed)
[facility]  Patient:    [name], [name]                      MRN: [id number] Proc. Date: 08/28/00 Adm. Date:  [id number] Attending:  Thyra Breed CC:         Desma Maxim, M.D.  Candy Sledge, M.D.  Stefani Dama, M.D.   Procedure Report  PROCEDURE:  Lumbar epidural steroid injection.  DIAGNOSIS:  Lumbar spondylosis with pain radiating out into both lower extremities and in the back.  INTERVAL HISTORY:  The patient notes marked improvement after the first injection.  He is interested in continuing his injections today.  PHYSICAL EXAMINATION:  Blood pressure 118/55, heart rate 77, respiratory rate 16, O2 saturations 96%.  Temperature is 97.1.  He described his pain level at 6/10, but he is not taking any of his pain medications today.  He states that when he takes his pain medications, he has nearly no pain.  DESCRIPTION OF PROCEDURE:  After informed consent was obtained, the patient was placed in the sitting position and monitored.  His back was prepped with Betadine x 3.  A skin wheal was raised at the L4-L5 interspace with 1% lidocaine.  A 20 gauge Tuohy needle was introduced to the lumbar epidural space to loss of resistance to preservative-free normal saline.  There was no CSF nor blood.  Medrol 120 mg in 8 mL of preservative-free normal saline was slowly injected. The needle was flushed with 2 cc of preservative-free normal saline and removed intact.  Postprocedure condition - stable.  DISCHARGE INSTRUCTIONS: 1. Resume previous diet. 2. Limitations on activities per instruction sheet which was reviewed with the    patient by RN assisting me. 3. Continue on current medications. 4. Follow up with me next week for a third injection. DD:  08/28/00 TD:  08/29/00 Job: [id number]

## 2011-05-16 NOTE — H&P (Signed)
[facility]  Patient:    [name], [name]                      MRN: [id number] Adm. Date:  [id number] Attending:  Thyra Breed CC:         Stefani Dama, M.D.  Desma Maxim, M.D.   History and Physical  FOLLOWUP EVALUATION:  Diontae comes in for a followup evaluation of his chronic low back pain on the basis of lumbar spondylosis, status post laminectomy.  Since his last evaluation, he continues to have stiffness in his lower back and is asking about when he can get another injection.  He states that the stiffness is worse in the morning and he attributes some of this to his mattress.  He continues on the Vioxx 25 mg a day and the Norco and feels as they are very helpful for his pain.  He is status post surgical intervention for his neck and he has been released from Dr. Stefani Dama for this.  EXAMINATION:  VITAL SIGNS:  Blood pressure is 107/62.  Heart rate 72.  Respiratory rate is 16.  O2 saturation is 96%.  Pain level is 8/10.  NEUROLOGIC:  His neuro exam is unchanged from his last visit.  MEDICATIONS:  Other medications include Neurontin, hydrochlorothiazide, Vioxx, Claritin, Tegretol, Lipitor, Zestril, Serzone, Robaxin and Xanax.  IMPRESSION: 1. Low back pain on the basis of lumbar spondylosis. 2. Neck pain, status post surgical intervention. 3. Other medical problems per Dr. Desma Maxim.  DISPOSITION: 1. Continue on current medical regimen.  He does not need a prescription for    his Vioxx or Norco today. 2. Schedule with me in September for a repeat series of lumbar epidural    steroid injections. DD:  07/16/01 TD:  07/17/01 Job: [id number]

## 2011-05-16 NOTE — Procedures (Signed)
[facility]  Patient:    [name], [name]                      MRN: [id number] Adm. Date:  [id number] Attending:  Thyra Breed CC:         Desma Maxim, M.D.   Procedure Report  PROCEDURE:  Lumbar epidural steroid injection.  DIAGNOSES:  Lumbar spondylosis with lumbar radiculopathy and post laminectomy pain syndrome.  INTERVAL HISTORY:  The patient has noted ongoing improvement after his second injection. He is here for this third today.  PHYSICAL EXAMINATION:  VITAL SIGNS:  Please see flow sheet for vital signs.  BACK:  Exam shows good healing of his injection site.  DESCRIPTION OF PROCEDURE:  After informed consent was obtained, the patient was placed in a sitting position and monitored.  His back was prepped with Betadine x 3. A skin wheal was raised at the L3-4 inner space with 1% lidocaine.  A #20 gauge Tuohy needle was introduced through the lumbar epidural space to loss of resistance to preservative-free normal saline. There was no CSF nor blood.  Medrol 120 mg and 8 mL of preservative-free normal saline was gently injected.  The needle was flushed with preservative-free normal saline and removed intact.  POSTPROCEDURE CONDITION:  Stable.  DISCHARGE INSTRUCTIONS: 1. Resume previous diet. 2. Limitation of activities per instruction sheet. 3. Continue on current medications with prescription written for    Norco 10/325 mg one p.o. q4h. p.r.n. not to exceed five tablets    per day, #150. 4. Follow-up with me in 4-8 weeks. DD:  03/29/01 TD:  03/29/01 Job: [id number]

## 2011-05-16 NOTE — Discharge Summary (Signed)
NAME:  [name], [name]                         ACCOUNT NO.:  [id number]   MEDICAL RECORD NO.:  [id number]                   PATIENT TYPE:  INP   LOCATION:  3007                                 FACILITY:  [facility]   PHYSICIAN:  Isla Pence, M.D.             DATE OF BIRTH:  1946/03/02   DATE OF ADMISSION:  02/17/2004  DATE OF DISCHARGE:  02/24/2004                                 DISCHARGE SUMMARY   DISCHARGE DIAGNOSES:  1. Pseudomembranous colitis.  2. Scrotal cellulitis.  3. History of recent shoulder acromioplasty with acromioclavicular resection     and open rotator cuff tear with debridement of biceps by Dr. Lubertha Basque.     Dalldorf.  4. History of chronic back pain with multiple epidural steroid injections     and multiple surgeries.  5. History of hypertension.  6. History of depression.  7. History of seasonal allergic rhinitis.  8. Recent left leg and arm cellulitis.  9. History of obstructive sleep apnea, status post __________ since he was     being intolerant of CPAP.  10.      Normocytic normochromic anemia which needs to be followed up as an     outpatient.  Certainly would be consistent with his recent colitis and     expect this to improve.   DISCHARGE MEDICATIONS:  1. Flagyl 500 mg p.o. t.i.d. x 4 more days, and that is the only new     medication that he would be going home on.  2. He is to resume all of his home medications and these consist of:     a. Multivitamin one p.o. every day.     b. Calcium with vitamin D 500 p.o. every day.     c. Serzone 150 mg two p.o. b.i.d.     d. OxyContin 20 mg p.o. t.i.d.     e. Xanax 0.75 mg p.o. every day and q.8h. p.r.n. anxiety.     f. Neurontin 600 mg p.o. t.i.d.     g. Allegra 60 mg p.o. b.i.d.     h. Vitamin C 1,000 mg p.o. every day.   DISCHARGE DIET:  I have advised him to continue to stay off dairy products  for another five days, and to continue with the yogurt with the  lactobacillus for about 3-4 more days.   This is primarily to facilitate the  recent diarrhea, since patients with diarrhea will have mild temporary  lactose intolerance.   DISCHARGE ACTIVITY:  As tolerated.   DISCHARGE FOLLOWUP:  I have recommended to the patient that he followup with  his primary care physician, Dr. Arvilla Market, in two weeks.   HOSPITAL COURSE:  This 65 year old white male was admitted, on February 18, 2004, after presenting to the emergency room with weakness, diarrhea and  abdominal pain.  The patient had been on bouts of antibiotics for his  cellulitis and then subsequently for his  scrotal cellulitis and certainly  was predisposed to Pseudomembranous colitis.  His C. diff toxin indeed came  back positive.  He initially, prior to the C. diff toxin positive test, was  started on Cipro and Flagyl.  Once the results came back, his Cipro was  discontinued, and the patient was just kept on oral Flagyl.  His stool  cultures were negative for any other bacterial causes.  The patient was  significantly dehydrated as the result of his significant diarrhea.  He was  hydrated and has done well.  He continued to have frequency of bowel  movements and watery bowel movements even up until February 20, 2004.  He  was, therefore, taken off of all dairy products.  He was also advised to  have some yogurt with lactobacillus to facilitate re-growth of his normal  gut flora.  This did help with his symptoms, in addition to having his Cipro  discontinued for his scrotal cellulitis.  His scrotal cellulitis is markedly  improved by the 23rd, and, therefore, as mentioned earlier, his Cipro was  continued around that time.  On February 22, 2004, the patient apparently  reported an increase in his bowel movements with watery diarrhea; therefore,  a repeat C. diff was done and that came back negative.  I advised him to  indeed use the yogurt with lactobacillus since he had come off of it for  that morning.  His stools have  subsequently formed up, and he has been  tolerating oral intake very well.  His scrotal cellulitis has also  completely resolved.   His white count, initially, was 14,000 and at the time of discharge the  white count had normalized to 10,000 and this was done on February 21, 2004.  He is slightly anemic at 11.4 and 33.7 and this can be followed up by his  primary care physician.  His latest electrolytes, done of the day of  discharge, showed a sodium of 139, potassium of 4.5, chloride and CO2 at 127  and 27, BUN and creatinine 9 and 0.7, glucose of 92.   X-RAY STUDIES:  He had CT scan of his abdomen, on the 20th, to better define  his medical condition and it came back showing diffuse inflammatory process  involving the colon which would be going along with his Pseudomembranous  colitis.  Otherwise there was no other activity within the bowel.  There was  no free air and no obstruction.   The patient is being discharged to home in stable condition and with  followup as previously mentioned.                                                Isla Pence, M.D.    RRV/MEDQ  D:  02/24/2004  T:  02/24/2004  Job:  [id number]   cc:   Donia Guiles, M.D.  [address] [place]  [place] [address]  Fax: [phone]

## 2011-05-16 NOTE — Op Note (Signed)
NAME:  [name], [name]               ACCOUNT NO.:  [id number]   MEDICAL RECORD NO.:  [id number]          PATIENT TYPE:  AMB   LOCATION:  SDS                          FACILITY:  [facility]   PHYSICIAN:  Lubertha Basque. Dalldorf, M.D.DATE OF BIRTH:  1946-04-09   DATE OF PROCEDURE:  04/27/2007  DATE OF DISCHARGE:                               OPERATIVE REPORT   PREOPERATIVE DIAGNOSES:  1. Right shoulder impingement.  2. Right shoulder rotator cuff tear.   POSTOPERATIVE DIAGNOSES:  1. Right shoulder impingement.  2. Right shoulder partial rotator cuff tear.  3. Right shoulder biceps degeneration.   PROCEDURE:  1. Right shoulder arthroscopic acromioplasty.  2. Right shoulder arthroscopic partial clavulectomy.  3. Right shoulder arthroscopic biceps debridement.   ANESTHESIA:  General.   ATTENDING SURGEON:  Lubertha Basque. Jerl Santos, M.D.   ASSISTANT:  Lindwood Qua, P.A.   INDICATIONS FOR PROCEDURE:  The patient is a 65 year old man with a long  history of right shoulder pain.  This has persisted despite oral anti-  inflammatories and an exercise program.  An injection did afford him  several weeks a significant relief.  He has pain which limits his  ability to use his arm and to rest, and he is offered an arthroscopy.  Informed operative consent was obtained after discussion of the possible  complications of  reaction to anesthesia and infection.  The patient has  sleep apnea, and as result we are performing this procedure in the main  operating room, and our intention is to keep him overnight for  observation.   SUMMARY OF FINDINGS AND PROCEDURE:  Under general anesthesia, an  arthroscopy of the right shoulder was performed.  The glenohumeral joint  showed no degenerative changes, and the biceps tendon showed some  significant tearing and degeneration.  We debrided about 20% of this  tendon back to stable structures and elected not to perform a tenotomy.  The rotator cuff had some tearing  seen from below, but no complete tear  was seen.  In the subacromial space, this was confirmed with no  significant full-thickness tear seen.  The tissues appeared fairly good.  He did have a prominent subacromial morphology addressed with an  acromioplasty back to a flat surface.  He also had a prominence of the  [facility] joint for which a partial clavulectomy was done as well.  Bryna Colander assisted throughout and was invaluable to completion of the  case in that he helped position and pass instruments while I performed  procedure.   DESCRIPTION OF PROCEDURE:  The patient was taken to the operating suite  where general anesthetic was applied without difficulty.  He was  positioned in beach chair position and prepped and draped in the normal  sterile fashion.  After administration of IV Kefzol, an arthroscopy of  the right shoulder was performed through a total of three portals.  The  glenohumeral joint showed no degenerative change, and the biceps tendon  did show some significant degeneration and tearing.  Some portions of  this tendon were stuck in the joint.  A debridement was done, but about  80% of the thickness of the tendon remained and appeared to be fairly  healthy, so we did not sacrifice this structure.  The rotator cuff, as  mentioned above, did have some partial-thickness tearing seen from  below.  In the subacromial space, we performed the acromioplasty with  the bur in the lateral position followed by transfer of the bur to the  posterior position.  I also performed a partial clavulectomy without a  formal AC decompression.  This was done with the bur in the same two  positions as the acromioplasty.  The shoulder was thoroughly irrigated  at the end of the case followed by placement of Marcaine with  epinephrine and morphine.  Simple sutures of nylon were used to loosely  reapproximate the portals followed by Adaptic and a dry gauze dressing  with tape.  Estimated blood loss  and intraoperative fluids can be  obtained from the anesthesia records.   DISPOSITION:  The patient was extubated in the operating room and taken  to the recovery room in stable addition.  He was to be admitted for  overnight observation because of the sleep apnea and will likely be  discharged home in the morning.      Lubertha Basque Jerl Santos, M.D.  Electronically Signed     PGD/MEDQ  D:  04/27/2007  T:  04/27/2007  Job:  [id number]

## 2011-05-16 NOTE — Discharge Summary (Signed)
NAME:  [name], [name]                         ACCOUNT NO.:  [id number]   MEDICAL RECORD NO.:  [id number]                   PATIENT TYPE:  INP   LOCATION:  5025                                 FACILITY:  [facility]   PHYSICIAN:  Deirdre Peer. Polite, MD                DATE OF BIRTH:  26-Oct-1946   DATE OF ADMISSION:  09/07/2002  DATE OF DISCHARGE:  09/09/2002                                 DISCHARGE SUMMARY   DISCHARGE DIAGNOSES:  1. Nausea and vomiting with abdominal discomfort, questionable     gastroenteritis.  2. Dysuria with negative urinalysis.  3. Orthostasis on admission.  4. History of multiple back surgeries with resultant chronic pain.  5. History of alcoholism.  No ETOH for several years.  6. History of chronic headaches.  7. History of hypertension.  8. Hyperlipidemia.  9. Chronic Xanax use.  10.      History of snoring status post palatoplasty.   DISCHARGE MEDICATIONS:  1. Flomax 0.4 mg daily at bedtime.  2. Neurontin 600 mg 3 times a day.  3. Hydrochlorothiazide 25 mg daily.  4. Zestril 10 mg daily.  5. Serzone 300 mg twice a day.  6. Xanax 1 mg twice a day.  7. Hydrocodone 5 times a day.   CONSULTATIONS:  Dr. Tasia Catchings, gastroenterology.   PROCEDURES AND STUDIES:  1. Chest x-ray on September 07, 2002:  No evidence of acute cardiopulmonary     disease.  2. EKG, September 10:  Normal sinus rhythm.  3. EGD with CLO test on September 11:  Results revealed normal EGD.  CLO     test pending.   LABORATORY DATA:  Admission metabolic panel with glucose of 116, BUN of 24,  albumin 3.2, creatinine 0.8, sodium 139, potassium 3.9, chloride 108, C02  27.  Admission CBC with a WBC count of 9.3, RBC count 4.51, hemoglobin 14.3,  hematocrit 41.5.  Cardiac enzymes with a CK of 72, CK-MB 2.4, troponin 0.04.  Lipid profile with a cholesterol of 148, triglycerides 77, HDL 37, LDL 96.  Urinalysis negative.  Discharge chemistries within normal limits except for  calcium of 8.1,  liver enzymes within normal limits.  Discharge hemoglobin  12.3, hematocrit 35.7.   DISPOSITION:  Patient will be discharged home.   HISTORY OF PRESENT ILLNESS:  This is a 65 year old male who presented to  [facility] Emergency Room on September 07, 2002, with reports of 6-week  history of diffuse abdominal pain associated with constipation that he  presumed was secondary to his pain medications.  Over the same amount of  time, the patient also noted dysuria with incomplete bladder-emptying.  The  patient reported having taken Vioxx 25 mg a day, as well as Goody Powders,  Excedrin, and other analgesics.  On the day of admission, he presented to  Dr. Roselie Skinner office with symptoms of dizziness.  A rectal exam was done  that revealed  melenotic stools.  He then presented to [facility] ER, where  he was found to have hemoccult-positive stools.  Of note, patient had a  colonoscopy in 2001 that showed a polyp but was otherwise normal.  In the  emergency room, he was noted to be moderately postural.  He was admitted for  further evaluation and treatment of an upper GI bleed.   HOSPITAL COURSE:  1. Probable upper GI bleed secondary to NSAID use:  His admission hemoglobin     was 14.3, hematocrit 41.5, with a very minimal drop to 12.3 and 35.7 at     discharge.  He had no further signs of GI bleeding.  A GI consult was     obtained on September 11, at which time he underwent an EGD with CLO.     EGD was normal, with recommendations to advance diet and restart meds.     He tolerated his diet, p.o. intake was good.  Upon questioning and     discharge, the patient did not recall ever vomiting blood, only remembers     seeing bright red blood on his toilet paper when wiping.  He also did not     recall having any melenotic stools.   1. Dysuria/questionable prostatitis/BPH symptoms:  Urinalysis was negative.     He was treated empirically with Cipro and was started on Flomax 0.4 mg     daily.  At  discharge, Cipro was discontinued.  He had no complaints of     dysuria.  Flomax was continued.  Continuation of Flomax will be left to     his primary care physician.   1. Orthostasis:  On admission, there was a note of orthostasis, with only 1     blood pressure reading of 100/60.  Throughout the remainder of his     hospitalization, his blood pressure remained in the low 100 range     systolic.  The patient was ambulating without any signs or symptoms of     orthostasis.   1. Multiple medical problems:  Patient will be discharged on his previous     medication regimen for all of his chronic medical problems, which did     remain stable throughout his hospitalization, except for Vioxx.   FOLLOW UP:  Patient will have a 1-week followup with his primary care  physician, Dr. Donia Guiles.     Stephanie [place]az[place], NP                      Deirdre Peer. Polite, MD    SJ/MEDQ  D:  09/09/2002  T:  09/11/2002  Job:  [id number]   cc:   Desma Maxim, M.D.

## 2011-05-16 NOTE — Op Note (Signed)
[facility]  Patient:    [name], [name]                      MRN: [id number] Proc. Date: 09/04/00 Adm. Date:  [id number] Attending:  Thyra Breed CC:         Desma Maxim, M.D.   Operative Report  PROCEDURE:  Lumbar epidural steroid injection.  DIAGNOSIS:  Lumbar spondylosis with radiation of pain into both lower extremities.  INTERVAL HISTORY:  The patient has noted improvement after the last injection to a very significant extent.  PHYSICAL EXAMINATION:  Blood pressure 142/52, heart rate 67, respiratory rate 16, O2 saturations 98%, temperature 97.8, and pain level is 5/10.  He shows good healing from his previous injection site.  Neuro exam is grossly unchanged.  DESCRIPTION OF PROCEDURE:  After informed consent was obtained, the patient was placed in the sitting position and monitored.  His back was prepped with Betadine x 3.  A skin wheal was raised at the L4-L5 interspace with 1% lidocaine.  A 20 gauge Tuohy needle was introduced to the lumbar epidural space approximately but was found to be in the spinal space.  The patient was made aware of this.  I retracted the needle back to where there was no CSF flow and then injected 120 mg of Medrol in 10 mL of preservative-free normal saline.  The patient tolerated this well.  The needle was removed intact.  Postprocedure condition - stable.  DISPOSITION: 1. The patient was encouraged to drink copious amounts of fluids. 2. Continue on current medications. 3. Resume previous diet. 4. Follow up with me in 3-4 weeks.  The patient is aware of his spinal and    states that in the past when he got a spinal with an epidural, he had a    better response. DD:  09/04/00 TD:  09/06/00 Job: [id number]

## 2011-05-16 NOTE — Procedures (Signed)
[facility]  Patient:    LINK, BURGESON                      MRN: [id number] Proc. Date: 05/15/00 Adm. Date:  [id number] Attending:  Thyra Breed CC:         Lubertha Basque. Jerl Santos, M.D.             Desma Maxim, M.D.             Candy Sledge, M.D.                           Procedure Report  FOLLOW-UP EVALUATION  The patient is doing remarkably well despite the pain level of 5/10.  He has noted that the injections have been tremendously helpful in reducing his discomfort.  He is much more active.  He complains mostly of his right knee today.  He plans to go ahead and see Dr. Jerl Santos in the near future for this.  PHYSICAL EXAMINATION:  Blood pressure is 138/95, heart rate 76, respiratory rate 16, oxygen saturations 98%.  Pain level is 5/10.  Temperature is 97.2. He has crepitus in the knees on range of motion. Deep tendon reflexes show absent right knee jerk, 2+ on the left.  Ankles 1+ and symmetric.  Straight leg raising signs are negative.  IMPRESSION: 1. Low back pain on the basis of lumbar spondylosis. 2. 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 3. Other medical problems per Desma Maxim, M.D.  DISPOSITION: 1. Continue on Norco 10/325 1 p.o. q.24h. p.r.n. not to exceed 5 tablets    per day #150, no refill. 2. Continue on other medications which include Tegretol, Neurontin and    Serzone. 3. Follow up with me in two months. DD:  05/15/00 TD:  05/20/00 Job: [id number]

## 2011-05-16 NOTE — Op Note (Signed)
NAME:  BLESS, BELSHE NO.:  [id number]   MEDICAL RECORD NO.:  [id number]          PATIENT TYPE:  INP   LOCATION:  5032                         FACILITY:  [facility]   PHYSICIAN:  Lubertha Basque. Dalldorf, M.D.DATE OF BIRTH:  1946-10-06   DATE OF PROCEDURE:  10/31/2004  DATE OF DISCHARGE:                                 OPERATIVE REPORT   PREOPERATIVE DIAGNOSIS:  Right knee degenerative arthritis.   POSTOPERATIVE DIAGNOSIS:  Right knee degenerative arthritis.   PROCEDURE:  Right total knee replacement.   SURGEON:  Lubertha Basque. Jerl Santos, M.D.   ASSISTANTS:  1.  Deidre Ala, M.D.  2.  Lindwood Qua, P.A.   ANESTHESIA:  General anesthesia.   INDICATIONS FOR PROCEDURE:  The patient is a 65 year old man with many years  of knee pain.  This has persisted despite oral anti-inflammatories and all  known injectables.  He is status post an arthroscopy five or six years ago  at which point we did see significant degenerative change.  At this point,  he has disabling pain which limits his activities and makes it difficult for  him to rest.  On x-ray, he has bone on bone degeneration.  He is offered a  knee replacement operation.  Informed operative consent was obtained after  discussion of possible complications, reaction to anesthesia, infection,  DVT, PE, and death.   DESCRIPTION OF PROCEDURE:  The patient was taken to the operating suite  where general anesthesia was applied without difficulty.  He was positioned  supine and prepped and draped in normal sterile fashion.  After the  administration after preoperative IV antibiotics, a longitudinal incision  was made with dissection down the extensor mechanism.  All appropriate anti-  infective measures were used including preoperative IV Kefzol, Betadine  impregnated drape, and closed hooded exhaust systems for each member of the  surgical team.  The medial parapatellar incision was made in the extensor  mechanism.  The knee  cap was flipped and the knee flexed.  Some residual  meniscal tissues were removed along with the ACL and PCL.  He had severe  degenerative change both medial and patella femoral but excellent bone  quality.  An extramedullary guide was placed on the tibia in order to create  a proximal cut on this bone parallel with the floor.  An intramedullary  guide was then placed in the femur to create anterior and posterior cuts  making a flexion gap of 12.5 mm.  A second intramedullary guide was placed  in the femur creating a distal cut making an equal extension gap of 12.5 mm  in the knee.  Minimal soft tissue release was required across the medial  tibia.  The femur sized to a large component while the tibia sized to a 5  and the appropriate guides placed and utilized.  The patella was cut down in  thickness by about 12 mm and sized to a 41 where the appropriate guide was  placed and utilized.  Trial reduction was done with all these components and  the 12.5 spacer.  Easily came to full extension  and flexed well.  The knee  cap tracked well.  Trial components were removed.  Pulsatile lavage was used  to expunge all cut bony surfaces followed by placement of antibiotic  containing cement and the aforementioned DePuy LCS components which were  large right femur, size 5 tibia, 41 mm all polyethylene patella and 12 mm  deep position spacer.  Excess cement was trimmed. The pressure was held  until cement had hardened.  The tourniquet was deflated and a small amount  of bleeding was easily controlled with Bovie cautery.  The knee was again  irrigated followed by placement of drain external superolaterally.  The  extensor mechanisms were reapproximated with #1 Vicryl in interrupted  fashion followed by subcutaneous reapproximation with 0 and 2-0 undyed  Vicryl followed by skin closure with staples.  Adaptic was placed on the  wound followed by dry gauze and the wound was Ace wrapped.  Estimated blood   loss, intraoperative fluids as well as accurate tourniquet time can be  obtained from anesthesia records.   DISPOSITION:  The patient was extubated to the operating room and taken to  the recovery room in stable condition.   PLAN:  Admit to orthopedic surgery service __________ to include  perioperative antibiotics, Coumadin plus Lovenox for DVT prophylaxis.  He  could flex to about 120 degrees at the end of the case.       PGD/MEDQ  D:  10/31/2004  T:  10/31/2004  Job:  [id number]

## 2011-05-16 NOTE — Op Note (Signed)
NAME:  [name], [name]                         ACCOUNT NO.:  [id number]   MEDICAL RECORD NO.:  [id number]                   PATIENT TYPE:  AMB   LOCATION:  DSC                                  FACILITY:  [facility]   PHYSICIAN:  Lubertha Basque. Jerl Santos, M.D.             DATE OF BIRTH:  17-Nov-1946   DATE OF PROCEDURE:  01/16/2004  DATE OF DISCHARGE:                                 OPERATIVE REPORT   PREOPERATIVE DIAGNOSES:  1. Left shoulder impingement.  2. Left shoulder acromioclavicular degenerative joint disease.  3. Left shoulder rotator cuff tear.   POSTOPERATIVE DIAGNOSES:  1. Left shoulder impingement.  2. Left shoulder acromioclavicular degenerative joint disease.  3. Left shoulder rotator cuff tear.  4. Left shoulder biceps degeneration.   PROCEDURES:  1. Left shoulder acromioplasty.  2. Left shoulder acromioclavicular resection.  3. Left shoulder open rotator cuff tear.  4. Left shoulder debridement biceps.   ANESTHESIA:  General and block.   ATTENDING SURGEON:  Lubertha Basque. Jerl Santos, M.D.   ASSESSMENT:  [name].   INDICATIONS FOR PROCEDURE:  The patient is a 65 year old man with many years  of left shoulder pain.  This has persisted despite oral anti-inflammatories  and multiple injections.  At this point he has pain at rest and pain with  activity, which is no longer responding to injection or rest.  He has  offered an arthroscopy.  Informed operative consent was obtained after  discussion of possible complications, reaction to anesthesia and infection.   DESCRIPTION OF PROCEDURE:  The patient was in the operating suite where  general anesthetic was applied without difficulty.  He was also given an  block in the preanesthesia area.  He was positioned in the beach chair  position and prepped and draped in the normal sterile fashion.  After  administration of brief IV antibiotics, and arthroscopy of the left shoulder  was performed through a total of three portals.  The  glenohumeral joint  showed no degenerative change.  The biceps then was significantly  degenerative.  A debridement was done and he was left with about 80% of the  thickness of the biceps tendon.  It was elected not to perform a tenotomy as  subacromial injections had always relieved his discomfort.   From below, the rotator cuff appeared to have a 2 cm tear.  This was fairly  anterior in the supraspinatus portion of the cuff.  The subacromial space  was entered and a great deal of bursitis addressed with thorough bursectomy.  A very prominent subacromial morphology addressed with an acromioplasty back  to a flat surface.  This was done with the bur in the lateral position  followed by transfer of the bur to the posterior position.  He had bone-on-  bone contact at the [facility] joint and a formal AC decompression was done through  the anterior portal.  It was elected to repair his rotator cuff  in an open  fashion.   A small incision was made on the anterolateral aspect of his shoulder with  dissection down through a split in the deltoid almost near the deltopectoral  interval.  Dissection was carried down to the rotator cuff.  A split of the  anterior aspect of the supraspinatus attachment was isolated.  A bleeding  bed of bone was created below this area followed by placement of a single  absorbable anchor with two sutures emanating.  These were passed through the  rotator cuff and tied in mattress fashion on the superior aspect of the  cuff, reapproximating this tissue to the bleeding bed of bone.  I then  placed one suture of Ethibond through the cuff into the bone at the  attachment site as well.  The shoulder was irrigated followed by  reapproximation of the deltoid fascial layers with 0 Vicryl in interrupted  fashion.  The subcutaneous tissues were reapproximated with 2-0 undyed  Vicryl and skin was closed with nylon.  Adaptic was placed on the wound,  followed by dry gauze and tape.   The arthroscopic portals were closed  loosely with nylon.   DISPOSITION:  The patient was extubated in the operating room stable.  The  plans were for him to go home the same day and to follow up in the office in  less than one week.  I will contact him by phone tonight.                                               Lubertha Basque Jerl Santos, M.D.    PGD/MEDQ  D:  01/16/2004  T:  01/17/2004  Job:  [id number]

## 2011-05-16 NOTE — Procedures (Signed)
[facility]  Patient:    [name], [name]                      MRN: [id number] Proc. Date: 03/05/01 Adm. Date:  [id number] Attending:  Thyra Breed                           Procedure Report  PROCEDURE:  Lumbar epidural steroid injection.  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  INTERVAL HISTORY:  The patients developed increasing radicular discomfort into the lower extremity and I advised him that we would try at least a single to two epidurals at this time to see if we could bring it under better control.  PHYSICAL EXAMINATION:  Blood pressure 135/60, heart rate 75, respiratory rate 18, O2 saturations 99%, pain level is 8/10. His back shows well healed surgical scars. Neuro exam is grossly unchanged from his last visit.  DESCRIPTION OF PROCEDURE:  After informed consent was obtained, the patient was placed in the sitting position and monitored. The patients back was prepped with Betadine x 3. A skin wheal was raised at the L4-5 interspace with 1 percent lidocaine. A 20 gauge Tuohy needle was introduced to the lumbar epidural space to loss of resistance to preservative free normal saline. Initially there was some back flow of cerebrospinal fluid and the needle was retracted back to when this cleared. I went ahead and injected 120 mg of Medrol and 8 ml of preservative free normal saline and flushed the needle with preservative free normal saline. The needle was removed intact.  The patient was advised that we did get into the spinal space and it was recommended that he drinks copious amounts of Coca Cola, at least two 2 liter bottles to three 2 liter bottles per day over the weekend. I went ahead and rewrote his prescription for Norco 10/325 one p.o. q. 4h p.r.n. up to five per day #150 with no refill.  I plan to see him back in follow-up in one week. DD:  03/05/01 TD:  03/06/01 Job:  [id number]

## 2011-05-16 NOTE — Op Note (Signed)
[place]. [facility]  Patient:    [name], [name]                      MRN: [id number] Proc. Date: 05/13/01 Adm. Date:  [id number] Disc. 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  OPERATION:  Anterior cervical diskectomy, revision arthrodesis at C3-4, fixation with Synthes plate and screws, exploration of arthrodesis at C5-6.  SURGEON:  Stefani Dama, M.D.  FIRST ASSISTANT:  Danae Orleans. Venetia Maxon, M.D.  ANESTHESIA:  General endotracheal.  INDICATIONS:  The patient is a 65 year old individual, who has had significant back, neck and arm pain.  He also had complaints of significant headache.  In December he underwent anterior arthrodesis at the C3-4 level, but, however, he has developed a pseudoarthrosis with loss of fixation anteriorly.  He is also concerned that he may have a pseudoarthrosis at his second previously fused level and at C5-6.  He is now being taken to the operating room to undergo surgical decompression and stabilization of the involved levels.  DESCRIPTION OF PROCEDURE:  The patient was brought to the operating room, placed on the table in the supine position.  After smooth induction of general endotracheal anesthesia, he was placed in 5 pounds of Holter traction. The neck was shaved, prepped with DuraPrep and draped in a sterile fashion. A previously made transverse incision superiorly was reopened in his neck and this was carried down through the platysma and the plane between the sternocleidomastoid and the strap muscles was dissected bluntly until the prevertebral space was reached.  The area was localized with a radiograph which identified the inferior margin of a plate at the O1-3 level and with further dissection  through the soft tissue and care being taken to protect the esophagus and retracted gingerly.  The plate was examined, evaluated, and then after removing significant scar tissue around it.  The screws were removed from C4 and also C3, and the plate was then removed.  C3-4 was grossly loose. The round fibular graft that had been placed into the wound broke into several parts, none of which were fused to adjacent bone.  These were removed.  There was dead scar tissue within the disk space itself and after removing this a diskectomy was completely performed opening out to the lateral gutters on either side completely.  Hemostasis from the soft tissues was obtained with a bipolar cautery and some small pledgets of Gelfoam soaked in thrombin which were later removed.  Then a patellar wedge graft was cut to the 8 mm height and fashioned appropriately, and this was placed into the inner space.  The traction was removed and a 20 mm standard Synthes plate was affixed with 16 mm locking screws.  A singular 4.35 mm x 16 mm screw was required in the left lower corner as this screw had stripped out.  Locking screws were applied localizing radiogram identified good position of the hardware.  Dissection was then carried inferiorly to explore the area of C5, C6.  As the disk space was felt to be entered, there was noted to be good bony cover ventrally.  Several radiographs were obtained to assure that we were in the region of  the C5-6 disk space but no pseudoarthrosis could be identified across this level. Solid arthrodesis was identified.  The area having been fully explored, the are was copiously irrigated with antibiotic irrigating solution and then a small Jackson-Pratt drain was placed into the wound, and brought out through a separate stab incision.  The platysma was closed with 3-0 Vicryl interrupted fashion, 3-0 Vicryl was used subcuticularly.  The patient tolerated the procedure well and was  returned to the recovery room in stable condition. DD:  05/13/01 TD:  05/15/01 Job: [id number]

## 2011-05-16 NOTE — Cardiovascular Report (Signed)
[place]. [facility]  Patient:    [name], [name]                      MRN: [id number] Proc. Date: 06/03/00 Adm. Date:  [id number] Disc. Date: [id number] Attending:  Meade Maw A                        Cardiac Catheterization  INDICATIONS FOR PROCEDURE:  Persistent chest pain.  Equivocal adenosine Cardiolite which was suggestive of a dilated left ventricle and ejection fraction of 60%.  There was decreased uptake in the anterior apical as well as the inferior posterior wall.  DESCRIPTION OF PROCEDURE:  After obtaining written informed consent, the patient was brought to the cardiac catheterization lab in the postabsorptive state.  Preop sedation was achieved using IV Versed.  The right femoral head was identified using radiographic technique.  Local anesthesia was achieved using 1% Xylocaine.  A 6 French hemostasis sheath was placed into the right femoral artery using a modified Seldinger technique.  Selective coronary angiography was performed using JL4, JR4 Judkins catheters.  Nonionic contrast was used and was hand injected for the coronaries.  A single plane ventriculogram was performed in the RAO position.  A 6 French pigtail curved catheter was used.  All catheter exchange were made over a guide wire.  The hemostasis sheath was flushed following each catheter exchange.  FINDINGS:  The aorta pressure was 137/77, LV pressure was 137/19.  There was no gradient noted on pullback.  Single plane ventriculogram revealed inferior basilar hypokinesis.  There was post PVC, MR noted.  Interpretation of ventriculogram was made difficult by the ventricular ectopy.  CORONARY ANGIOGRAPHY:  Left main coronary artery:  The left main coronary artery gave rise to an OM-2.  The true circumflex had a separate takeoff in the LAD. There was no significant disease in the left main coronary artery.  Left anterior descending:  The left anterior descending gave rise to  a moderate diagonal #1, small diagonal #2, small diagonal #3 and ended as an apical recurrent branch.  There was no significant disease in the LAD or its branches.  Circumflex vessel:  The circumflex vessel had a separate takeoff.  The OM-2 came off of the left main coronary artery.  There was no significant disease in the circumflex or the obtuse marginal.  Right coronary artery:  The right coronary artery was a moderate sized vessel that gave rise to a small RV marginal #1, RV marginal #2, and a PDA.  There was no significant disease in the right coronary artery.  IMPRESSION: 1. Normal coronary arteries.  Separate takeoff of the true circumflex from the    left main. 2. Normal left ventricular function.  RECOMMENDATIONS:  To consider other etiologies for his chest pain. False-positive adenosine Cardiolite is probable the result of his morbid obesity. DD:  06/03/00 TD:  06/05/00 Job: [id number]

## 2011-05-16 NOTE — H&P (Signed)
[place]. [facility]  Patient:    [name], [name]                      MRN: [id number] Adm. Date:  [id number] Disc. 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  HISTORY OF PRESENT ILLNESS:  The patient is a 65 year old individual who has had significant problems with cervical spondylitic disease in the past and he has undergone arthrodesis at the C3-4 level, on top of an arthrodesis at C4-5 and C5-6 that was performed in the early 1990s.  The patient had had increasing problems with neck pain, shoulder and arm pain, in addition to severe headaches, and it was felt that his spondylosis that had progressed significantly at C3-4 may be aggravating symptoms.  The patient underwent surgery in December of 2001 and had a good postoperative result early on; however, he has seen Dr. Lubertha Basque. Dalldorf in followup and a more recent film demonstrates that the patient has collapsed the bone graft and has dislodged the anterior hardware, with angulation of the inferior screws at the body in the plate at C4.  The patient was advised regarding the presence of a pseudoarthrosis; furthermore, films of the neck at the current time suggest the possibility of a pseudoarthrosis at the C5-C6 level.  I advised the patient regarding revision surgery; this would incision revision of the C3-4 level with removal of the plate and re-arthrodesis via an anterior procedure, and also exploration at the C5-6 level and if pseudoarthrosis is noted there, then decompression and stabilization would be re-performed at C5 and C6.  He is now admitted for this procedure.  PAST MEDICAL HISTORY:  The patient has a history of hypertension.  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  He has had previous lumbar disk surgery by me in 1991 and prior to that, he had  several other disk operations elsewhere.  He has had some foot surgery for a crush injury a number of years ago.  He does not smoke.  SOCIAL HISTORY:  He is married.  He has been disabled because of the difficulties with his back and his legs for the past years.  REVIEW OF SYSTEMS:  Systems review is notable for a history of hypertension, occasional swelling in the ankles, complaints of back pain, neck pain, arthritis in the shoulders, arms and hands.  He reports no breathing problems.  PHYSICAL EXAMINATION:  VITAL SIGNS:  His blood pressure is 150/92, heart rate 66 and regular, respirations 18.  GENERAL:  He is an alert, oriented, cooperative individual in no overt distress.  NECK:  Range of motion of his neck is severely limited, turning only 30 degrees to the right, 30 degrees to the left, he extends 10 degrees and flexes 10 degrees.  Axial compression reproduced localized neck pain.  No masses are palpable in the neck.  There is tenderness to palpation in the supraclavicular fossae.  NEUROLOGIC:  Motor strength is good in the deltoids, biceps, triceps, grips and intrinsics.  Lower extremity strength and reflexes are normal.  Deep tendon reflexes are 2+ in the patellae, 1+ in the Achilles.  Babinskis are downgoing.  Upper extremity reflexes are absent in the biceps, trace in the triceps, 1+ in  the brachioradialis.  Sensation is diminished into the distal lower extremities below the level of the ankles.  Station and gait are normal. Cranial nerve examination reveal the pupils are 4 mm, briskly reactive to light and accommodation.  The extraocular movements are full and the face is symmetric to grimace.  Tongue and uvula are in the midline.  EYES:  Sclerae and conjunctivae are clear.  IMPRESSION:  The patient has significant cervical spondylitic disease with pseudoarthrosis at the C3-4 level and possibly also at C5-6.  He has been advised regarding surgical decompression and  stabilization at possibly both these levels. DD:  05/13/01 TD:  05/14/01 Job: [id number]

## 2011-05-16 NOTE — Procedures (Signed)
[facility]  Patient:    [name], [name]                      MRN: [id number] Proc. Date: 03/18/01 Adm. Date:  [id number] Attending:  Thyra Breed CC:         Desma Maxim, M.D.   Procedure Report  PROCEDURE:  Lumbar epidural steroid injection.  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  ANESTHESIOLOGIST:  Thyra Breed, M.D.  INTERVAL HISTORY:  The patient has noted overall improvement but did develop a peculiar malaise after his last injection.  He did not get headaches.  He denied fevers.  He developed no erythema at the site of injection.  He is interested in getting another injection today.  He presents with his wife.  PHYSICAL EXAMINATION:  VITAL SIGNS:  Blood pressure 119/64, heart rate 72, respiratory rate 20, O2 saturation 98%, temperature 97.4.  BACK:  Shows good healing from previous injection site, and he has well-healed surgical scars.  DESCRIPTION OF PROCEDURE:  After informed consent was obtained, the patient was placed in the sitting position and monitored.  His back was prepped with Betadine x 3.  A skin wheal was raised at the L5-S1 interspace with 1% lidocaine.  A 20-gauge Tuohy needle was introduced in the lumbar epidural space to loss of resistance to preservative free normal saline.  There was no CSF nor blood.  Medrol 120 mg and 8 ml preservative free normal saline was gently injected.  The needle was flushed with preservative free normal saline and removed intact.  POSTPROCEDURE CONDITION:  Stable.  DISCHARGE INSTRUCTIONS: 1. Resume previous diet. 2. Limitation of activities per instruction sheet. 3. Continue on current medications. 4. Follow up with me in one to two weeks. DD:  03/18/01 TD:  03/18/01 Job: [id number]

## 2011-05-16 NOTE — Procedures (Signed)
[facility]  Patient:    [name], [name]                      MRN: [id number] Proc. Date: 12/18/00 Adm. Date:  [id number] Attending:  Thyra Breed CC:         Desma Maxim, M.D.   Procedure Report  PROCEDURE:  Lumbar epidural steroid injection.  DIAGNOSIS:  Lumbar strain with underlying lumbar spondylosis.  HISTORY OF PRESENT ILLNESS:  [name] notes that he recently was gathering pecans and had to shake a tree.  After shaking the tree, he felt his mid back pain increase and as he gathered the pecans that had fallen, it increased in severity.  It has not gotten better despite rest and conservative management, and he comes in today for an epidural.  PHYSICAL EXAMINATION:  Blood pressure 119/61, heart rate 70, respiratory rate 20, O2 saturations 98%.  His spirits are good.  His neuro exam is grossly unchanged.  DESCRIPTION OF PROCEDURE:  After informed consent was obtained, the patient was placed in the sitting position and monitored.  His back was prepped with Betadine x 3.  A skin wheal was raised at the L2-3 interspace with 1% lidocaine.  A 20 gauge Tuohy needle was introduced to the lumbar epidural space to loss of resistance to preservative-free normal saline.  There was no CSF nor blood.  Medrol 120 mg in 8 mL of preservative-free normal saline was gently injected.  The needle was flushed with preservative-free normal saline and removed intact.  Postprocedure condition - stable.  DISCHARGE INSTRUCTIONS: 1. Resume previous diet. 2. Limitations on activities per instruction sheet. 3. Continue on current medications. 4. The patient is to keep his next scheduled appointment. DD:  12/18/00 TD:  12/19/00 Job: [id number]

## 2011-09-25 LAB — TYPE AND SCREEN
ABO/RH(D): AB POS
Antibody Screen: NEGATIVE

## 2011-09-25 LAB — CBC
MCHC: 35.2
RDW: 13.7

## 2011-09-25 LAB — BASIC METABOLIC PANEL
CO2: 25
Calcium: 9.5
Creatinine, Ser: 1.02
Glucose, Bld: 96

## 2013-02-09 ENCOUNTER — Ambulatory Visit
Admission: RE | Admit: 2013-02-09 | Discharge: 2013-02-09 | Disposition: A | Discharge status: Home / Self Care | Payer: Medicare Other | Source: Ambulatory Visit | Attending: Family Medicine | Admitting: Family Medicine

## 2013-02-09 ENCOUNTER — Other Ambulatory Visit: Payer: Self-pay | Admitting: Family Medicine

## 2013-02-09 DIAGNOSIS — S92911A Unspecified fracture of right toe(s), initial encounter for closed fracture: Secondary | ICD-10-CM

## 2013-04-22 ENCOUNTER — Other Ambulatory Visit: Payer: Self-pay | Admitting: Neurology

## 2013-06-19 ENCOUNTER — Other Ambulatory Visit: Payer: Self-pay | Admitting: Neurology

## 2013-06-20 ENCOUNTER — Other Ambulatory Visit: Payer: Self-pay

## 2013-06-20 MED ORDER — TRILEPTAL 150 MG PO TABS: 150.0000 mg | ORAL_TABLET | Freq: Two times a day (BID) | ORAL | Status: DC

## 2013-06-20 NOTE — Telephone Encounter (Signed)
Patient requesting refills until next OV.  No showed last appt in March, now scheduled in late Sept.

## 2013-07-15 IMAGING — CR DG FOOT COMPLETE 3+V*R*
3 series · 3 of 3 positions shown · non-contrast
Comparison: None.

CLINICAL DATA: Dropped object on toe as with pain and swelling

RIGHT FOOT COMPLETE - 3+ VIEW

[t foot ap right]
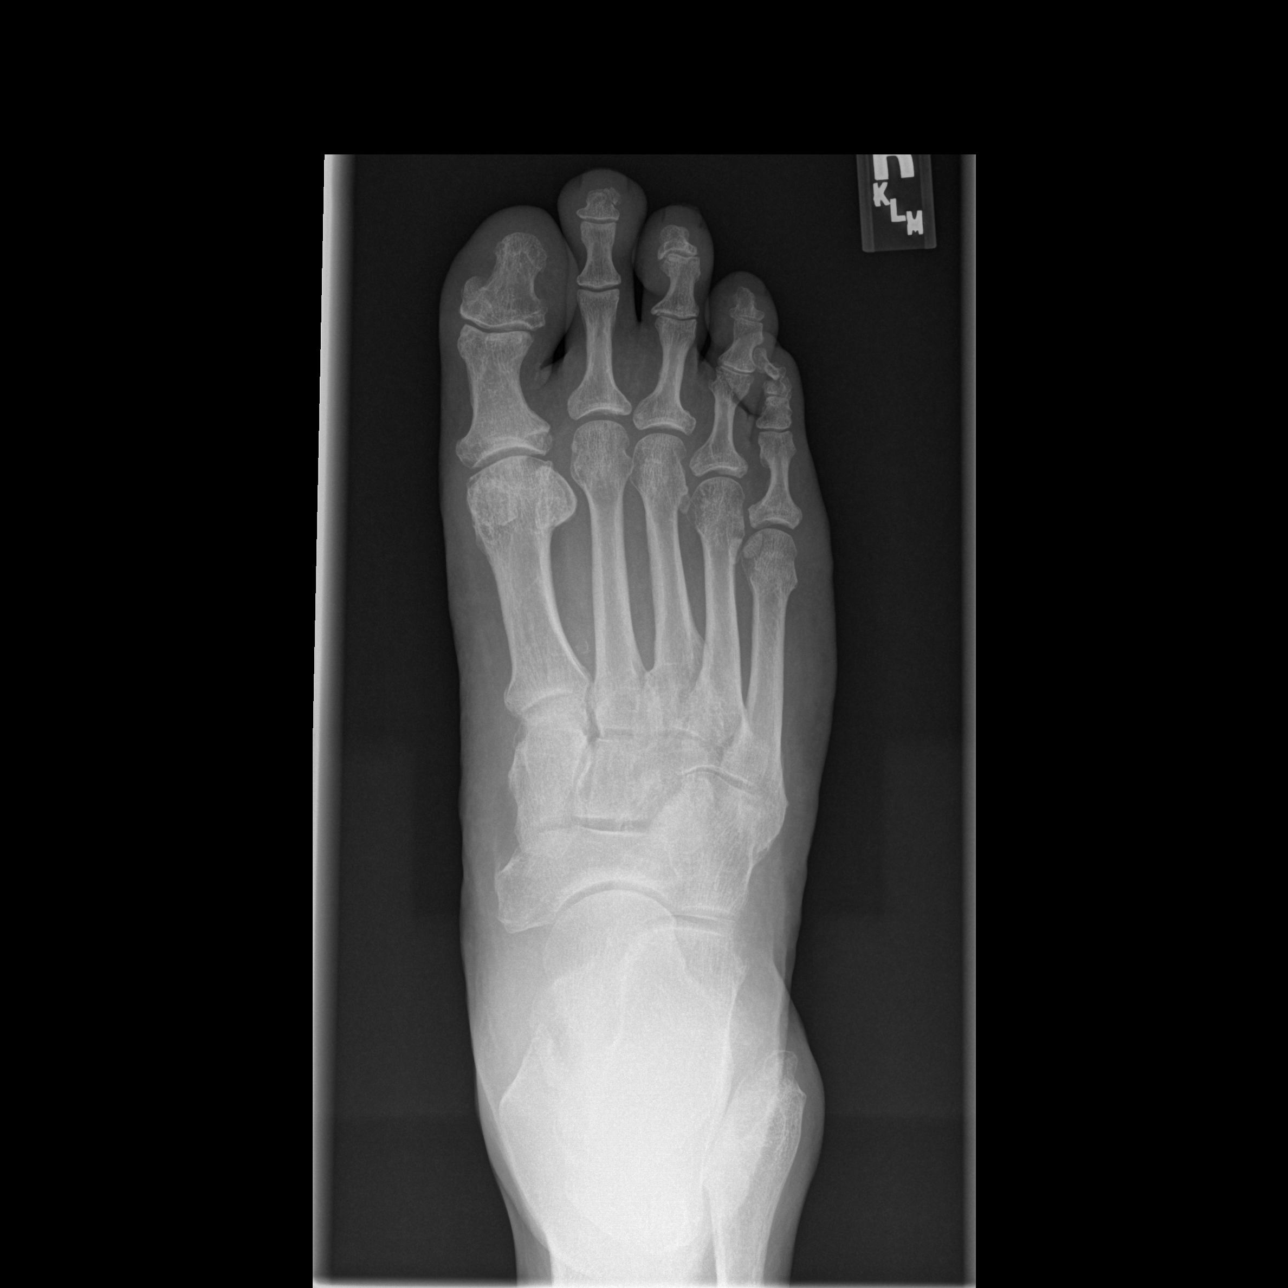

[t foot oblique right]
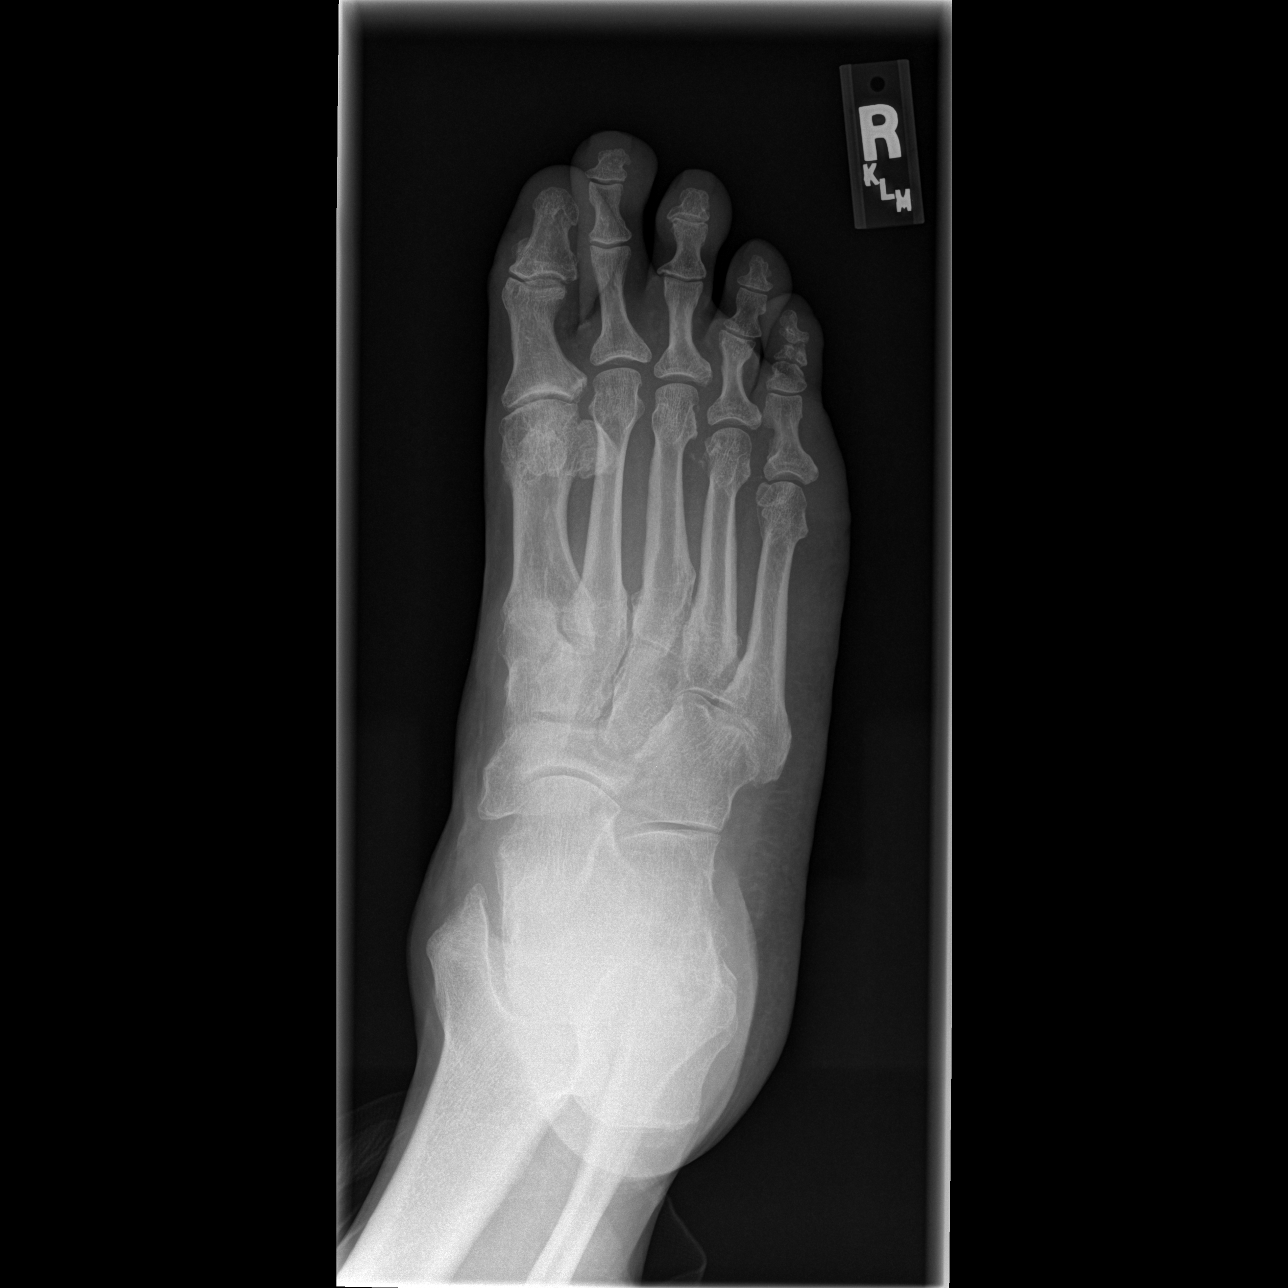

[t foot lat right]
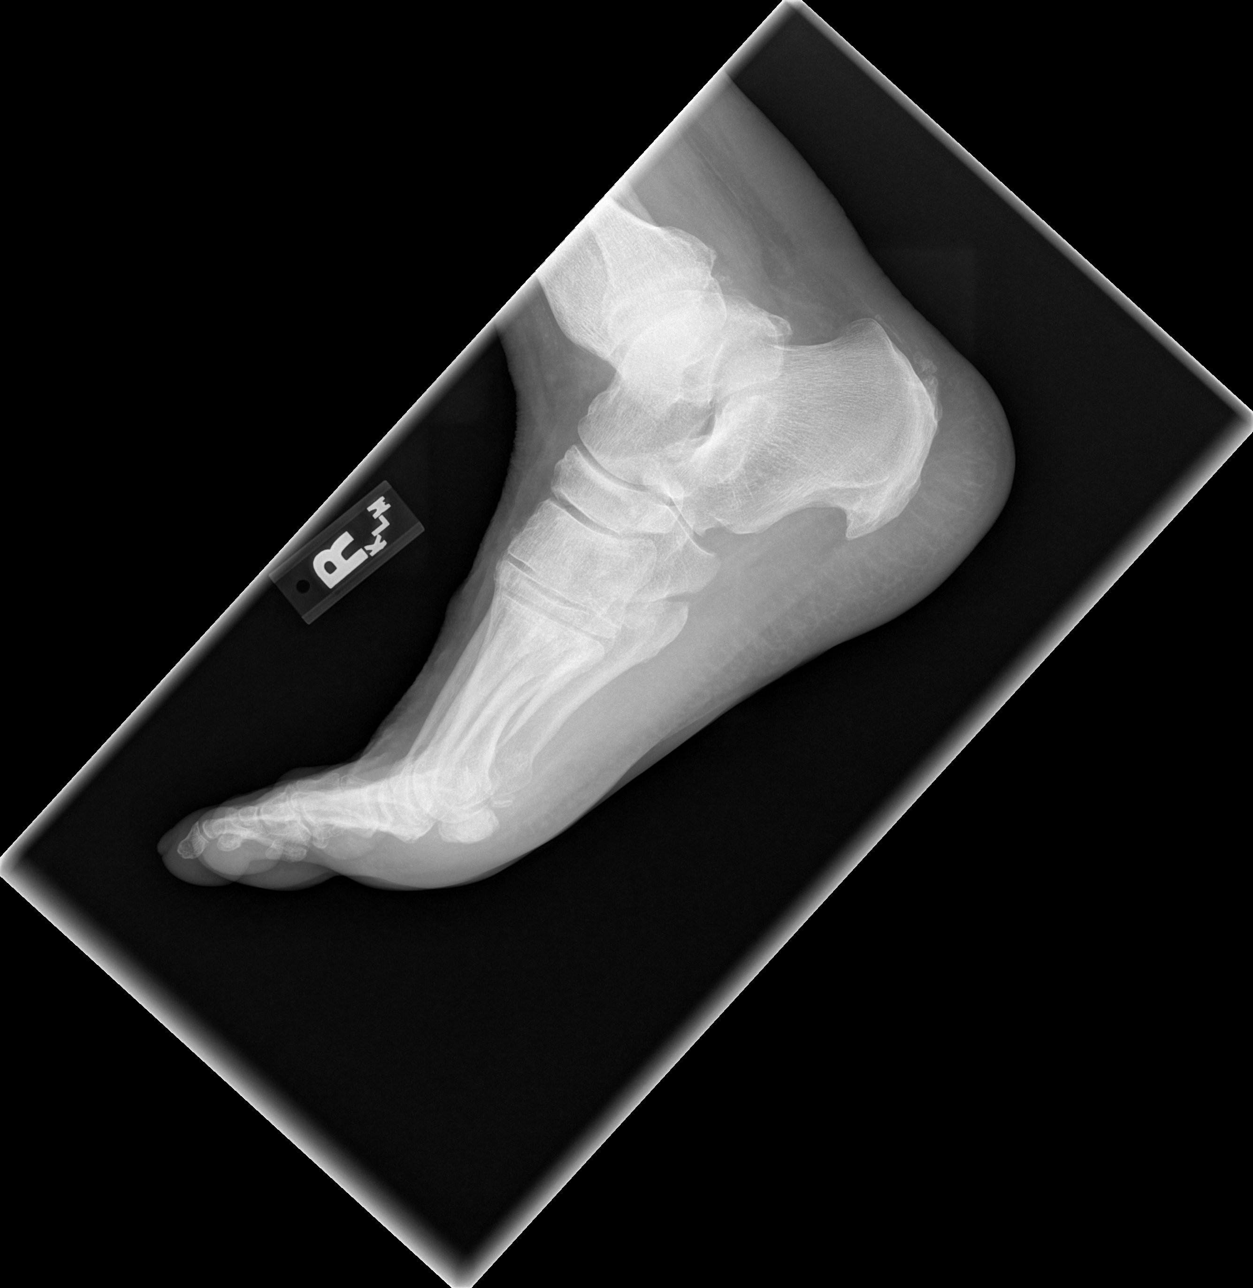

[3 of 3 positions shown; findings below may reference images not displayed]

FINDINGS: No definite acute fracture is seen.  A probable old
fracture deformity of the tuft of the distal phalanx of the right
second toe is noted although clinical correlation is recommended to
exclude a subtle nondisplaced fracture.  There are degenerative
changes involving the DIP and PIP joints as well as the right first
MTP joint.  No erosion is seen.
IMPRESSION: No definite acute fracture although there is slight irregularity of
the tuft of the distal phalanx of right second toe and clinical
correlation is recommended to exclude subtle fracture.

## 2013-09-26 ENCOUNTER — Encounter: Payer: Self-pay | Admitting: Neurology

## 2013-09-26 ENCOUNTER — Ambulatory Visit (INDEPENDENT_AMBULATORY_CARE_PROVIDER_SITE_OTHER): Payer: Medicare Other | Admitting: Neurology

## 2013-09-26 VITALS — BP 150/72 | HR 66 | Temp 98.0°F | Ht 68.25 in | Wt 279.0 lb

## 2013-09-26 DIAGNOSIS — G40209 Localization-related (focal) (partial) symptomatic epilepsy and epileptic syndromes with complex partial seizures, not intractable, without status epilepticus: Secondary | ICD-10-CM | POA: Insufficient documentation

## 2013-09-26 DIAGNOSIS — F05 Delirium due to known physiological condition: Secondary | ICD-10-CM | POA: Insufficient documentation

## 2013-09-26 NOTE — Progress Notes (Signed)
[facility] [address] [place]. [place] [address] [phone]       OFFICE FOLLOW-UP NOTE  [name] Date of Birth:  Sep 14, 1946 Medical Record Number:  [id number]   HPI:  89 year patient with episodes of transient confusion possible complex partial seizures who has done well on low dose Trileptal Update 09/26/2013  He returns for follow up today after last visit 11/04/2012.He is doing well without any confusion or seizure like episodes.He remains on Trileptal 150 mg twice daily and is tolerating it well without side effects.He was found to have elevated PSA levels and underwent prostate biospy by Dr [name] recently but results are yet pending.He also has meniscal tear in left knee and is wearing a brace and undergone intra articular injections.he has no neurological complaints today.  ROS:   14 system review of systems is positive for ringing in ears, snoring,easy bleeding,depression,anxiety,headache,not enough sleep,decreased energy,restless legs  PMH:  Past Medical History  Diagnosis Date  . ALLERGIC RHINITIS   . Sleep apnea     failed cpap  . Degenerative disc disease   . Confusion     vs. complex partial seizures  . DJD (degenerative joint disease)     neck  . Back pain   . Hyperlipemia   . Anxiety and depression     Social History:  History   Social History  . Marital Status: Married    Spouse Name: [name]    Number of Children: 0  . Years of Education: 9h   Occupational History  . retired    Social History Main Topics  . Smoking status: Passive Smoke Exposure - Never Smoker  . Smokeless tobacco: Not on file  . Alcohol Use: No     Comment: Quit: 1990  . Drug Use: No     Comment: Quit: 1990  . Sexual Activity: Not on file   Other Topics Concern  . Not on file   Social History Narrative   Cut asbestos x 2 months around 1985.   Patient lives at home with his spouse.   Caffeine Use: 2-3 cans daily    Medications:   Current  Outpatient Prescriptions on File Prior to Visit  Medication Sig Dispense Refill  . ALPRAZolam (XANAX) 0.5 MG tablet Take one By mouth once daily       . FLUTICASONE PROPIONATE, NASAL, NA by Nasal route. Use 2 sprays each nostril once daily at bedtime       . hydrocodone-acetaminophen (LORCET-HD) 5-500 MG per capsule Take 1 capsule by mouth every 6 (six) hours as needed.        . multivitamin (THERAGRAN) per tablet Take 1 tablet by mouth daily.        Marland Kitchen oxyCODONE (OXYCONTIN) 20 MG 12 hr tablet Take as directed       . oxymetazoline (AFRIN NASAL SPRAY) 0.05 % nasal spray 2 sprays by Nasal route 3 (three) times daily.        . simvastatin (ZOCOR) 20 MG tablet Take 20 mg by mouth at bedtime.        . TRILEPTAL 150 MG tablet Take 1 tablet (150 mg total) by mouth 2 (two) times daily.  60 tablet  3   No current facility-administered medications on file prior to visit.    Allergies:  Not on File Filed Vitals:   09/26/13 1416  BP: 150/72  Pulse: 66  Temp: 98 F (36.7 C)    Physical Exam General: well developed, well nourished,  seated, in no evident distress Head: head normocephalic and atraumatic. Orohparynx benign Neck: supple with no carotid or supraclavicular bruits Cardiovascular: regular rate and rhythm, no murmurs Musculoskeletal: no deformity Skin:  no rash/petichiae Vascular:  Normal pulses all extremities Filed Vitals:   09/26/13 1416  BP: 150/72  Pulse: 66  Temp: 98 F (36.7 C)    Neurologic Exam Mental Status: Awake and fully alert. Oriented to place and time. Recent and remote memory intact. Attention span, concentration and fund of knowledge appropriate. Mood and affect appropriate.  Cranial Nerves: Fundoscopic exam not done . Pupils equal, briskly reactive to light. Extraocular movements full without nystagmus. Visual fields full to confrontation. Hearing intact. Facial sensation intact. Face, tongue, palate moves normally and symmetrically.  Motor: Normal bulk and  tone. Normal strength in all tested extremity muscles. Sensory.: intact to touch and pinprick and vibratory sensation.  Coordination: Rapid alternating movements normal in all extremities. Finger-to-nose and heel-to-shin performed accurately bilaterally. Gait and Station: Arises from chair without difficulty. Stance is normal. Gait demonstrates normal stride length and balance . Able to heel, toe and tandem walk without difficulty.  Reflexes: 1+ and symmetric. Toes downgoing.     ASSESSMENT: 41 year patient with episodes of transient confusion possible complex partial seizures who has done well on low dose Trileptal    PLAN: Continue Trileptal 150 mg twice daily. Check complete metabolic panel and CBC labs today. Return for followup in one year or call earlier if necessary.

## 2013-09-26 NOTE — Patient Instructions (Addendum)
Continue Trileptal 150 mg twice daily. Check complete metabolic panel and CBC labs today. Return for followup in one year or call earlier if necessary.

## 2013-09-27 LAB — CBC
MCHC: 33.8 g/dL (ref 31.5–35.7)
MCV: 91 fL (ref 79–97)
Platelets: 251 10*3/uL (ref 150–379)
RDW: 13.9 % (ref 12.3–15.4)
WBC: 10.2 10*3/uL (ref 3.4–10.8)

## 2013-09-27 LAB — COMPREHENSIVE METABOLIC PANEL
Albumin/Globulin Ratio: 1.6 (ref 1.1–2.5)
Albumin: 4.2 g/dL (ref 3.6–4.8)
Alkaline Phosphatase: 66 IU/L (ref 39–117)
BUN/Creatinine Ratio: 20 (ref 10–22)
BUN: 18 mg/dL (ref 8–27)
Chloride: 103 mmol/L (ref 96–108)
GFR calc Af Amer: 100 mL/min/{1.73_m2} (ref >59)
GFR calc non Af Amer: 87 mL/min/{1.73_m2} (ref >59)
Potassium: 4.4 mmol/L (ref 3.5–5.2)
Total Bilirubin: 0.2 mg/dL (ref 0.0–1.2)

## 2013-09-29 NOTE — Progress Notes (Signed)
Quick Note:  I called pt and let him know the lab results. (Normal). He verbalized understanding. ______

## 2013-11-08 ENCOUNTER — Ambulatory Visit: Payer: Medicare Other

## 2013-11-08 ENCOUNTER — Ambulatory Visit: Payer: Medicare Other | Admitting: Radiation Oncology

## 2013-11-10 ENCOUNTER — Other Ambulatory Visit: Payer: Self-pay | Admitting: Neurology

## 2014-03-08 ENCOUNTER — Other Ambulatory Visit: Payer: Self-pay | Admitting: Orthopaedic Surgery

## 2014-03-08 DIAGNOSIS — M25562 Pain in left knee: Secondary | ICD-10-CM

## 2014-03-16 ENCOUNTER — Ambulatory Visit
Admission: RE | Admit: 2014-03-16 | Discharge: 2014-03-16 | Disposition: A | Discharge status: Home / Self Care | Payer: Medicare Other | Source: Ambulatory Visit | Attending: Orthopaedic Surgery | Admitting: Orthopaedic Surgery

## 2014-03-16 DIAGNOSIS — M25562 Pain in left knee: Secondary | ICD-10-CM

## 2014-04-09 ENCOUNTER — Other Ambulatory Visit: Payer: Self-pay | Admitting: Neurology

## 2014-07-11 ENCOUNTER — Telehealth: Payer: Self-pay | Admitting: *Deleted

## 2014-07-11 NOTE — Telephone Encounter (Signed)
A 7 month supply was sent to [facility] on 04/09/14.  I called the pharmacy.  Spoke with Dorene GrebeNatalie, she said the patient has several refills remaining.  I called the patient back.  They are aware and will follow up with the pharmacy.

## 2014-09-26 ENCOUNTER — Ambulatory Visit (INDEPENDENT_AMBULATORY_CARE_PROVIDER_SITE_OTHER): Payer: Medicare Other | Admitting: Neurology

## 2014-09-26 ENCOUNTER — Encounter (INDEPENDENT_AMBULATORY_CARE_PROVIDER_SITE_OTHER): Payer: Self-pay

## 2014-09-26 ENCOUNTER — Encounter: Payer: Self-pay | Admitting: Neurology

## 2014-09-26 VITALS — BP 136/53 | HR 54 | Wt 281.8 lb

## 2014-09-26 DIAGNOSIS — G40209 Localization-related (focal) (partial) symptomatic epilepsy and epileptic syndromes with complex partial seizures, not intractable, without status epilepticus: Secondary | ICD-10-CM

## 2014-09-26 NOTE — Patient Instructions (Signed)
I had a long discussion with patient and wife regards to possible trial of tapering and stopping Trileptal as he has been episode free for several years but after weighing pros and cons he decided to stay on Trileptal for now. Check CBC and Sodium levels which Dr Clelia CroftShaw probably did last week.. Follow up in 1 year or call earlier if necessary.

## 2014-09-26 NOTE — Progress Notes (Signed)
[facility] [address] [place]. [place] [address] [phone]       OFFICE FOLLOW-UP NOTE  [name] Date of Birth:  28-Apr-1946 Medical Record Number:  [id number]   HPI:  75 year patient with episodes of transient confusion possible complex partial seizures who has done well on low dose Trileptal Update 09/26/2014 : He returns for f/u after last visit 1 year ago. He is doing well without any sideeffects and tolerating tRILEPTAL 150 MG TWICE DAILY. hE HAD A ANNUAL PHYSICAL AND LAB WORK BY dR kIMBERLY sHAW LAST WEK BUT i DO NOT HAVE THOSE RECORDS TODAY TO REVIEW. hE HAS NOT HAD ANY SEIZURE LIKE EPISODES FOR SEVERAL YEARS.. hE HAS NO NEW COMPLAINTS Update 09/26/2013  He returns for follow up today after last visit 11/04/2012.He is doing well without any confusion or seizure like episodes.He remains on Trileptal 150 mg twice daily and is tolerating it well without side effects.He was found to have elevated PSA levels and underwent prostate biospy by Dr Kathrynn Running recently but results are yet pending.He also has meniscal tear in left knee and is wearing a brace and undergone intra articular injections.he has no neurological complaints today.  ROS:   14 system review of systems is positive for ringing in ears, snoring,easy bleeding,depression,anxiety,headache,not enough sleep,decreased energy,restless legs  PMH:  Past Medical History  Diagnosis Date  . ALLERGIC RHINITIS   . Sleep apnea     failed cpap  . Degenerative disc disease   . Confusion     vs. complex partial seizures  . DJD (degenerative joint disease)     neck  . Back pain   . Hyperlipemia   . Anxiety and depression     Social History:  History   Social History  . Marital Status: Married    Spouse Name: lynda    Number of Children: 0  . Years of Education: 9h   Occupational History  . retired    Social History Main Topics  . Smoking status: Passive Smoke Exposure - Never Smoker  . Smokeless  tobacco: Not on file  . Alcohol Use: No     Comment: Quit: 1990  . Drug Use: No     Comment: Quit: 1990  . Sexual Activity: Not on file   Other Topics Concern  . Not on file   Social History Narrative   Cut asbestos x 2 months around 1985.   Patient lives at home with his spouse.   Caffeine Use: 2-3 cans daily    Medications:   Current Outpatient Prescriptions on File Prior to Visit  Medication Sig Dispense Refill  . ALPRAZolam (XANAX) 0.5 MG tablet Take one By mouth once daily       . citalopram (CELEXA) 20 MG tablet       . gabapentin (NEURONTIN) 800 MG tablet       . lisinopril-hydrochlorothiazide (PRINZIDE,ZESTORETIC) 20-25 MG per tablet       . multivitamin (THERAGRAN) per tablet Take 1 tablet by mouth daily.        Marland Kitchen oxyCODONE (OXYCONTIN) 20 MG 12 hr tablet Take as directed       . oxyCODONE-acetaminophen (PERCOCET) 10-325 MG per tablet       . oxymetazoline (AFRIN NASAL SPRAY) 0.05 % nasal spray 2 sprays by Nasal route 3 (three) times daily.        . simvastatin (ZOCOR) 20 MG tablet Take 20 mg by mouth at bedtime.        Marland Kitchen  tamsulosin (FLOMAX) 0.4 MG CAPS capsule       . TRILEPTAL 150 MG tablet TAKE 1 TABLET (150 MG TOTAL) BY MOUTH 2 (TWO) TIMES DAILY.  60 tablet  6   No current facility-administered medications on file prior to visit.    Allergies:  Not on File Filed Vitals:   09/26/14 1535  BP: 136/53  Pulse: 54    Physical Exam General: well developed, well nourished, seated, in no evident distress Head: head normocephalic and atraumatic. Orohparynx benign Neck: supple with no carotid or supraclavicular bruits Cardiovascular: regular rate and rhythm, no murmurs Musculoskeletal: no deformity Skin:  no rash/petichiae Vascular:  Normal pulses all extremities Filed Vitals:   09/26/14 1535  BP: 136/53  Pulse: 54    Neurologic Exam Mental Status: Awake and fully alert. Oriented to place and time. Recent and remote memory intact. Attention span, concentration  and fund of knowledge appropriate. Mood and affect appropriate.  Cranial Nerves: Fundoscopic exam not done . Pupils equal, briskly reactive to light. Extraocular movements full without nystagmus. Visual fields full to confrontation. Hearing intact. Facial sensation intact. Face, tongue, palate moves normally and symmetrically.  Motor: Normal bulk and tone. Normal strength in all tested extremity muscles. Sensory.: intact to touch and pinprick and vibratory sensation.  Coordination: Rapid alternating movements normal in all extremities. Finger-to-nose and heel-to-shin performed accurately bilaterally. Gait and Station: Arises from chair without difficulty. Stance is normal. Gait demonstrates normal stride length and balance . Able to heel, toe and tandem walk without difficulty.  Reflexes: 1+ and symmetric. Toes downgoing.     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 Check CBC and Sodium levels which Dr Clelia CroftShaw probably did last week.. Follow up in 1 year or call earlier if necessary.

## 2014-11-14 ENCOUNTER — Other Ambulatory Visit: Payer: Self-pay | Admitting: Neurology

## 2015-09-27 ENCOUNTER — Ambulatory Visit (INDEPENDENT_AMBULATORY_CARE_PROVIDER_SITE_OTHER): Payer: Medicare Other | Admitting: Neurology

## 2015-09-27 ENCOUNTER — Encounter: Payer: Self-pay | Admitting: Neurology

## 2015-09-27 VITALS — BP 139/63 | HR 45 | Ht 69.0 in | Wt 265.6 lb

## 2015-09-27 DIAGNOSIS — G3184 Mild cognitive impairment, so stated: Secondary | ICD-10-CM

## 2015-09-27 DIAGNOSIS — F329 Major depressive disorder, single episode, unspecified: Secondary | ICD-10-CM

## 2015-09-27 DIAGNOSIS — F32A Depression, unspecified: Secondary | ICD-10-CM

## 2015-09-27 DIAGNOSIS — R413 Other amnesia: Secondary | ICD-10-CM | POA: Diagnosis not present

## 2015-09-27 NOTE — Patient Instructions (Addendum)
I had a long discussion the patient and his wife regarding his remote seizures which appear quite stable and on current dose of Trileptal which he will continue. He has new complaints of memory loss and mild cognitive difficulties which need evaluation. I recommend he continue Lexapro which he has recently started for treatment of underlying depression but check lab work for treatable causes, EEG and MRI scan,CBC and CMP Return for follow-up in 2 months or call earlier if necessary.  Management of Memory Problems  There are some general things you can do to help manage your memory problems.  Your memory may not in fact recover, but by using techniques and strategies you will be able to manage your memory difficulties better.  1)  Establish a routine.  Try to establish and then stick to a regular routine.  By doing this, you will get used to what to expect and you will reduce the need to rely on your memory.  Also, try to do things at the same time of day, such as taking your medication or checking your calendar first thing in the morning.  Think about think that you can do as a part of a regular routine and make a list.  Then enter them into a daily planner to remind you.  This will help you establish a routine.  2)  Organize your environment.  Organize your environment so that it is uncluttered.  Decrease visual stimulation.  Place everyday items such as keys or cell phone in the same place every day (ie.  Basket next to front door)  Use post it notes with a brief message to yourself (ie. Turn off light, lock the door)  Use labels to indicate where things go (ie. Which cupboards are for food, dishes, etc.)  Keep a notepad and pen by the telephone to take messages  3)  Memory Aids  A diary or journal/notebook/daily planner  Making a list (shopping list, chore list, to do list that needs to be done)  Using an alarm as a reminder (kitchen timer or cell phone alarm)  Using cell phone to  store information (Notes, Calendar, Reminders)  Calendar/White board placed in a prominent position  Post-it notes  In order for memory aids to be useful, you need to have good habits.  It's no good remembering to make a note in your journal if you don't remember to look in it.  Try setting aside a certain time of day to look in journal.  4)  Improving mood and managing fatigue.  There may be other factors that contribute to memory difficulties.  Factors, such as anxiety, depression and tiredness can affect memory.  Regular gentle exercise can help improve your mood and give you more energy.  Simple relaxation techniques may help relieve symptoms of anxiety  Try to get back to completing activities or hobbies you enjoyed doing in the past.  Learn to pace yourself through activities to decrease fatigue.  Find out about some local support groups where you can share experiences with others.  Try and achieve 7-8 hours of sleep at night.

## 2015-09-27 NOTE — Progress Notes (Signed)
[facility] [address] [place]. [place] [address] [phone]       OFFICE FOLLOW-UP NOTE  [name] Date of Birth:  [birth year] Medical Record Number:  [id number]   HPI:  27 year patient with episodes of transient confusion possible complex partial seizures who has done well on low dose Trileptal Update 09/26/2014 : He returns for f/u after last visit 1 year ago. He is doing well without any sideeffects and tolerating tRILEPTAL 150 MG TWICE DAILY. hE HAD A ANNUAL PHYSICAL AND LAB WORK BY dR kIMBERLY sHAW LAST WEK BUT i DO NOT HAVE THOSE RECORDS TODAY TO REVIEW. hE HAS NOT HAD ANY SEIZURE LIKE EPISODES FOR SEVERAL YEARS.. hE HAS NO NEW COMPLAINTS Update 09/26/2013  He returns for follow up today after last visit 11/04/2012.He is doing well without any confusion or seizure like episodes.He remains on Trileptal 150 mg twice daily and is tolerating it well without side effects.He was found to have elevated PSA levels and underwent prostate biospy by Dr Kathrynn Running recently but results are yet pending.He also has meniscal tear in left knee and is wearing a brace and undergone intra articular injections.he has no neurological complaints today. Update 09/27/2015 : She returns for follow-up after his last visit to year ago. He's had no further seizures. He is noted worsening of memory difficulties in the last 2 months. He has trouble moving dates he often misplaces objects in his possessions. He is otherwise quite independent and is driving and has never gotten lost. Patient was not cooperative for Mini-Mental status exam today as he states he cannot read and and has trouble completing the test. He does admit to significant stress in his home due to his wife's medical condition. Patient has remote history of depression and recently has been started on Lexapro by his primary physician. He does have family history of Alzheimer's on his mother's side. ROS:   14 system review of systems is  positive for  ringing in the ears, difficult the urinating, joint pain and swelling, back pain, neck pain, snoring, most, itching, memory loss, headache, confusion, depression and all other systems negative PMH:  Past Medical History  Diagnosis Date  . ALLERGIC RHINITIS   . Sleep apnea     failed cpap  . Degenerative disc disease   . Confusion     vs. complex partial seizures  . DJD (degenerative joint disease)     neck  . Back pain   . Hyperlipemia   . Anxiety and depression   . Seizures   . Memory loss   . Hypertension     Social History:  Social History   Social History  . Marital Status: Married    Spouse Name: lynda  . Number of Children: 0  . Years of Education: 9h   Occupational History  . retired    Social History Main Topics  . Smoking status: Passive Smoke Exposure - Never Smoker  . Smokeless tobacco: Not on file  . Alcohol Use: No     Comment: Quit: 1990  . Drug Use: No     Comment: Quit: 1990  . Sexual Activity: Not on file   Other Topics Concern  . Not on file   Social History Narrative   Cut asbestos x 2 months around 1985.   Patient lives at home with his spouse.   Caffeine Use: 2-3 cans daily    Medications:   Current Outpatient Prescriptions on File Prior to Visit  Medication  Sig Dispense Refill  . ALPRAZolam (XANAX) 0.5 MG tablet Take one By mouth once daily     . citalopram (CELEXA) 20 MG tablet     . gabapentin (NEURONTIN) 800 MG tablet     . lisinopril-hydrochlorothiazide (PRINZIDE,ZESTORETIC) 20-25 MG per tablet     . multivitamin (THERAGRAN) per tablet Take 1 tablet by mouth daily.      . OXcarbazepine (TRILEPTAL) 150 MG tablet TAKE 1 TABLET (150 MG TOTAL) BY MOUTH 2 (TWO) TIMES DAILY. 60 tablet 11  . oxyCODONE-acetaminophen (PERCOCET) 10-325 MG per tablet     . oxymetazoline (AFRIN NASAL SPRAY) 0.05 % nasal spray 2 sprays by Nasal route 3 (three) times daily.      . tamsulosin (FLOMAX) 0.4 MG CAPS capsule      No current  facility-administered medications on file prior to visit.    Allergies:  Not on File Filed Vitals:   09/27/15 1011  BP: 139/63  Pulse: 45    Physical Exam General: well developed, well nourished, seated, in no evident distress Head: head normocephalic and atraumatic.    Neck: supple with no carotid or supraclavicular bruits Cardiovascular: regular rate and rhythm, no murmurs Musculoskeletal: no deformity Skin:  no rash/petichiae Vascular:  Normal pulses all extremities Filed Vitals:   09/27/15 1011  BP: 139/63  Pulse: 45    Neurologic Exam Mental Status: Awake and fully alert. Oriented to place and time. Recent and remote memory intact. Attention span, concentration and fund of knowledge appropriate. Mood and affect appropriate. Diminished recall 0/3. Animal naming 10. Clock drawing 4/4. Cranial Nerves: Fundoscopic exam not done . Pupils equal, briskly reactive to light. Extraocular movements full without nystagmus. Visual fields full to confrontation. Hearing intact. Facial sensation intact. Face, tongue, palate moves normally and symmetrically.  Motor: Normal bulk and tone. Normal strength in all tested extremity muscles. Sensory.: intact to touch and pinprick and vibratory sensation.  Coordination: Rapid alternating movements normal in all extremities. Finger-to-nose and heel-to-shin performed accurately bilaterally. Gait and Station: Arises from chair without difficulty. Stance is normal. Gait demonstrates normal stride length and balance . Able to heel, toe and tandem walk without difficulty.  Reflexes: 1+ and symmetric. Toes downgoing.     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 New memory difficulties likely mild cognitive impairment with suboptimally treated depression    PLAN: I had a long discussion the patient and his wife regarding his remote seizures which appear  quite stable and on current dose of Trileptal which he will continue. He has new complaints of memory loss and mild cognitive difficulties which need evaluation. I recommend he continue Lexapro which he has recently started for treatment of underlying depression but check lab work for treatable causes, EEG and MRI scan,CBC and CMP Return for follow-up in 2 months or call earlier if necessary.  Delia Heady, MD

## 2015-09-28 ENCOUNTER — Telehealth: Payer: Self-pay

## 2015-09-28 LAB — VITAMIN B12: Vitamin B-12: 739 pg/mL (ref 211–946)

## 2015-09-28 LAB — COMPREHENSIVE METABOLIC PANEL
ALBUMIN: 4 g/dL (ref 3.6–4.8)
ALT: 16 IU/L (ref 0–44)
AST: 16 IU/L (ref 0–40)
Albumin/Globulin Ratio: 1.7 (ref 1.1–2.5)
Alkaline Phosphatase: 54 IU/L (ref 39–117)
BILIRUBIN TOTAL: 0.3 mg/dL (ref 0.0–1.2)
BUN / CREAT RATIO: 20 (ref 10–22)
BUN: 17 mg/dL (ref 8–27)
CO2: 26 mmol/L (ref 18–29)
CREATININE: 0.85 mg/dL (ref 0.76–1.27)
Calcium: 9.1 mg/dL (ref 8.6–10.2)
Chloride: 93 mmol/L — ABNORMAL LOW (ref 97–108)
GFR calc non Af Amer: 89 mL/min/{1.73_m2} (ref >59)
GFR, EST AFRICAN AMERICAN: 103 mL/min/{1.73_m2} (ref >59)
GLUCOSE: 104 mg/dL — AB (ref 65–99)
Globulin, Total: 2.4 g/dL (ref 1.5–4.5)
Potassium: 4.7 mmol/L (ref 3.5–5.2)
Sodium: 133 mmol/L — ABNORMAL LOW (ref 134–144)
TOTAL PROTEIN: 6.4 g/dL (ref 6.0–8.5)

## 2015-09-28 LAB — TSH: TSH: 0.759 u[IU]/mL (ref 0.450–4.500)

## 2015-09-28 LAB — CBC
HEMATOCRIT: 36.7 % — AB (ref 37.5–51.0)
HEMOGLOBIN: 12 g/dL — AB (ref 12.6–17.7)
MCH: 30.7 pg (ref 26.6–33.0)
MCHC: 32.7 g/dL (ref 31.5–35.7)
MCV: 94 fL (ref 79–97)
Platelets: 210 10*3/uL (ref 150–379)
RBC: 3.91 x10E6/uL — AB (ref 4.14–5.80)
RDW: 14.1 % (ref 12.3–15.4)
WBC: 7.7 10*3/uL (ref 3.4–10.8)

## 2015-09-28 LAB — RPR: RPR: NONREACTIVE

## 2015-09-28 NOTE — Telephone Encounter (Signed)
-----   Message from Micki Riley, MD sent at 09/28/2015  8:36 AM EDT ----- [name] inform the patient that all labs done- comprehensive metabolic panel, CBC, thyroid hormone, syphilis and vitamin B12 tests are normal

## 2015-09-28 NOTE — Telephone Encounter (Signed)
Spoke to patient. Gave lab results. Patient verbalized understanding.  

## 2015-10-12 ENCOUNTER — Inpatient Hospital Stay: Admission: RE | Admit: 2015-10-12 | Payer: Self-pay | Source: Ambulatory Visit

## 2015-10-16 ENCOUNTER — Other Ambulatory Visit: Payer: Medicare Other

## 2015-11-19 ENCOUNTER — Other Ambulatory Visit: Payer: Self-pay | Admitting: Neurology

## 2015-12-03 ENCOUNTER — Ambulatory Visit (INDEPENDENT_AMBULATORY_CARE_PROVIDER_SITE_OTHER): Payer: Medicare Other | Admitting: Neurology

## 2015-12-03 DIAGNOSIS — R410 Disorientation, unspecified: Secondary | ICD-10-CM

## 2015-12-03 DIAGNOSIS — R413 Other amnesia: Secondary | ICD-10-CM

## 2015-12-05 ENCOUNTER — Telehealth: Payer: Self-pay

## 2015-12-05 NOTE — Telephone Encounter (Signed)
Called patient w/ EEG resullts. No answer. Will try later.

## 2015-12-05 NOTE — Telephone Encounter (Signed)
-----   Message from Pramod S Sethi, MD sent at 12/05/2015  8:28 AM EST ----- Kindly inform the patient that EEG study was normal 

## 2015-12-05 NOTE — Progress Notes (Signed)
No answer. 12/05/15 cl

## 2015-12-06 ENCOUNTER — Ambulatory Visit
Admission: RE | Admit: 2015-12-06 | Discharge: 2015-12-06 | Disposition: A | Discharge status: Home / Self Care | Payer: Medicare Other | Source: Ambulatory Visit | Attending: Neurology | Admitting: Neurology

## 2015-12-06 DIAGNOSIS — R413 Other amnesia: Secondary | ICD-10-CM

## 2015-12-06 NOTE — Telephone Encounter (Signed)
Rn call patient and his wife to inform them the EEG study was normal. Both patient and wife verbalized understanding.

## 2015-12-06 NOTE — Telephone Encounter (Signed)
-----   Message from Micki RileyPramod S Sethi, MD sent at 12/05/2015  8:28 AM EST ----- Kindly inform the patient that EEG study was normal

## 2015-12-07 ENCOUNTER — Ambulatory Visit (INDEPENDENT_AMBULATORY_CARE_PROVIDER_SITE_OTHER): Payer: Medicare Other | Admitting: Neurology

## 2015-12-07 ENCOUNTER — Encounter: Payer: Self-pay | Admitting: Neurology

## 2015-12-07 VITALS — BP 124/69 | HR 63 | Ht 69.0 in | Wt 242.0 lb

## 2015-12-07 DIAGNOSIS — G3184 Mild cognitive impairment, so stated: Secondary | ICD-10-CM

## 2015-12-07 MED ORDER — OXCARBAZEPINE 150 MG PO TABS: 150.0000 mg | ORAL_TABLET | Freq: Two times a day (BID) | ORAL | Status: DC

## 2015-12-07 NOTE — Patient Instructions (Signed)
I had a long discussion with the patient, wife and daughter regarding his remote seizures which appear quite stable and well controlled on Trileptal 150 mg twice daily which is tolerating well without side effects. I gave him a refill for a year and advise him to continue it. He also has mild cognitive impairment which is perhaps related to suboptimally treated depression. Continue treatment of depression as per primary physician. I encouraged him to take fish oil as well as participate in cognitively challenging task like playing bridge, solving crossword puzzles, sudoku. I also personally reviewed his brain MRI scan, EEG and lab work and discussed results with the patient and family and answered questions. He will return for follow-up in a year or call earlier if necessary.  Memory Compensation Strategies  1. Use "WARM" strategy.  W= write it down  A= associate it  R= repeat it  M= make a mental note  2.   You can keep a Glass blower/designerMemory Notebook.  Use a 3-ring notebook with sections for the following: calendar, important names and phone numbers,  medications, doctors' names/phone numbers, lists/reminders, and a section to journal what you did  each day.   3.    Use a calendar to write appointments down.  4.    Write yourself a schedule for the day.  This can be placed on the calendar or in a separate section of the Memory Notebook.  Keeping a  regular schedule can help memory.  5.    Use medication organizer with sections for each day or morning/evening pills.  You may need help loading it  6.    Keep a basket, or pegboard by the door.  Place items that you need to take out with you in the basket or on the pegboard.  You may also want to  include a message board for reminders.  7.    Use sticky notes.  Place sticky notes with reminders in a place where the task is performed.  For example: " turn off the  stove" placed by the stove, "lock the door" placed on the door at eye level, " take your  medications" on  the bathroom mirror or by the place where you normally take your medications.  8.    Use alarms/timers.  Use while cooking to remind yourself to check on food or as a reminder to take your medicine, or as a  reminder to make a call, or as a reminder to perform another task, etc.

## 2015-12-07 NOTE — Progress Notes (Signed)
[facility] [address] [place]. [place] [id number] [phone]       OFFICE FOLLOW-UP NOTE  [name] Date of Birth:  11/12/1946 Medical Record Number:  [id number]   HPI:  [id number] year patient with episodes of transient confusion possible complex partial seizures who has done well on low dose Trileptal Update 09/26/2014 : He returns for f/u after last visit 1 year ago. He is doing well without any sideeffects and tolerating tRILEPTAL 150 MG TWICE DAILY. hE HAD A ANNUAL PHYSICAL AND LAB WORK BY dR kIMBERLY sHAW LAST WEK BUT i DO NOT HAVE THOSE RECORDS TODAY TO REVIEW. hE HAS NOT HAD ANY SEIZURE LIKE EPISODES FOR SEVERAL YEARS.. hE HAS NO NEW COMPLAINTS Update 09/26/2013  He returns for follow up today after last visit 11/04/2012.He is doing well without any confusion or seizure like episodes.He remains on Trileptal 150 mg twice daily and is tolerating it well without side effects.He was found to have elevated PSA levels and underwent prostate biospy by Dr Kathrynn RunningManning recently but results are yet pending.He also has meniscal tear in left knee and is wearing a brace and undergone intra articular injections.he has no neurological complaints today. Update 09/27/2015 : She returns for follow-up after his last visit to year ago. He's had no further seizures. He is noted worsening of memory difficulties in the last 2 months. He has trouble moving dates he often misplaces objects in his possessions. He is otherwise quite independent and is driving and has never gotten lost. Patient was not cooperative for Mini-Mental status exam today as he states he cannot read and and has trouble completing the test. He does admit to significant stress in his home due to his wife's medical condition. Patient has remote history of depression and recently has been started on Lexapro by his primary physician. He does have family history of Alzheimer's on his mother's side. Update 12/07/2015 : He returns for  follow-up after last visit 2 months ago. Is a complaint by wife and daughter. They state that his memory difficulties remain about the same. He has been started on new medication for depression by his primary physician and depression seems somewhat improved. Continues to have short-term memory difficulties. He has not had any significant worsening and is able to handle his own affairs. He did undergo MRI scan of the brain yesterday on 12/06/15 which I personally reviewed shows mild nonspecific small vessel disease changes and generalized atrophy without acute abnormality. Lab work done on 09/27/15 showed normal vitamin B12, TSH and RPR. EEG study done on 12/6 at 16 personally reviewed by me was normal. Patient does take fish oil every day but he does not participate in cognitively challenging activities. He is not had a seizure now for several years but seems to tolerating Trileptal quite well. ROS:   14 system review of systems is positive for  ringing in the ears, appetite change, apnea, snoring, memory loss, headache, joint pain and swelling, back pain, neck pain and stiffness, rash, agitation, behavioral problem, confusion, depression, anxiety and nervousness and all other systems negative PMH:  Past Medical History  Diagnosis Date  . ALLERGIC RHINITIS   . Sleep apnea     failed cpap  . Degenerative disc disease   . Confusion     vs. complex partial seizures  . DJD (degenerative joint disease)     neck  . Back pain   . Hyperlipemia   . Anxiety and depression   . Seizures (HCC)   .  Memory loss   . Hypertension     Social History:  Social History   Social History  . Marital Status: Married    Spouse Name: lynda  . Number of Children: 0  . Years of Education: 9h   Occupational History  . retired    Social History Main Topics  . Smoking status: Passive Smoke Exposure - Never Smoker  . Smokeless tobacco: Not on file  . Alcohol Use: No     Comment: Quit: 1990  . Drug Use: No      Comment: Quit: 1990  . Sexual Activity: Not on file   Other Topics Concern  . Not on file   Social History Narrative   Cut asbestos x 2 months around 1985.   Patient lives at home with his spouse.   Caffeine Use: 2-3 cans daily    Medications:   Current Outpatient Prescriptions on File Prior to Visit  Medication Sig Dispense Refill  . ALPRAZolam (XANAX) 0.5 MG tablet Take one By mouth once daily     . citalopram (CELEXA) 20 MG tablet     . diazepam (VALIUM) 5 MG tablet Take 5 mg by mouth every 8 (eight) hours as needed.  1  . escitalopram (LEXAPRO) 20 MG tablet 1 TABLET ONCE A DAY ORALLY  1  . finasteride (PROSCAR) 5 MG tablet Take 5 mg by mouth daily.  3  . gabapentin (NEURONTIN) 800 MG tablet Take 800 mg by mouth at bedtime.     Marland Kitchen ibuprofen (ADVIL,MOTRIN) 800 MG tablet TAKE 1 TABLET BY MOUTH EVERY 8 HOURS AS NEEDED FOR DENTAL PAIN  0  . lisinopril-hydrochlorothiazide (PRINZIDE,ZESTORETIC) 20-25 MG per tablet     . metoprolol succinate (TOPROL-XL) 50 MG 24 hr tablet 1 TABLET ONCE A DAY ORALLY 30 DAY(S)  5  . multivitamin (THERAGRAN) per tablet Take 1 tablet by mouth daily.      Marland Kitchen oxyCODONE-acetaminophen (PERCOCET) 10-325 MG per tablet     . oxymetazoline (AFRIN NASAL SPRAY) 0.05 % nasal spray 2 sprays by Nasal route 3 (three) times daily.      . simvastatin (ZOCOR) 40 MG tablet   4  . tamsulosin (FLOMAX) 0.4 MG CAPS capsule      No current facility-administered medications on file prior to visit.    Allergies:  Not on File Filed Vitals:   12/07/15 0913  BP: 124/69  Pulse: 63    Physical Exam General: well developed, well nourished, seated, in no evident distress Head: head normocephalic and atraumatic.    Neck: supple with no carotid or supraclavicular bruits Cardiovascular: regular rate and rhythm, no murmurs Musculoskeletal: no deformity Skin:  no rash/petichiae Vascular:  Normal pulses all extremities Filed Vitals:   12/07/15 0913  BP: 124/69  Pulse: 63     Neurologic Exam Mental Status: Awake and fully alert. Oriented to place and time. Recent and remote memory intact. Attention span, concentration and fund of knowledge appropriate. Mood and affect appropriate. MMSE 23/30 with deficits in attention, calculation and three-step commands. Clock drawing 4/4. Animal naming test 10.   Cranial Nerves: Fundoscopic exam not done . Pupils equal, briskly reactive to light. Extraocular movements full without nystagmus. Visual fields full to confrontation. Hearing intact. Facial sensation intact. Face, tongue, palate moves normally and symmetrically.  Motor: Normal bulk and tone. Normal strength in all tested extremity muscles. Sensory.: intact to touch and pinprick and vibratory sensation.  Coordination: Rapid alternating movements normal in all extremities. Finger-to-nose and heel-to-shin performed accurately bilaterally.  Gait and Station: Arises from chair without difficulty. Stance is normal. Gait demonstrates normal stride length and balance . Able to heel, toe and tandem walk with mild difficulty.  Reflexes: 1+ and symmetric. Toes downgoing.     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 New memory difficulties likely mild cognitive impairment with suboptimally treated depression    PLAN: I had a long discussion with the patient, wife and daughter regarding his remote seizures which appear quite stable and well controlled on Trileptal 150 mg twice daily which is tolerating well without side effects. I gave him a refill for a year and advise him to continue it. He also has mild cognitive impairment which is perhaps related to suboptimally treated depression. Continue treatment of depression as per primary physician. I encouraged him to take fish oil as well as participate in cognitively challenging task like playing bridge, solving crossword puzzles, sudoku.  I also personally reviewed his brain MRI scan, EEG and lab work and discussed results with the patient and family and answered questions. He will return for follow-up in a year or call earlier if necessary.  Delia Heady, MD

## 2016-09-15 ENCOUNTER — Encounter: Payer: Self-pay | Admitting: Neurology

## 2016-10-30 ENCOUNTER — Other Ambulatory Visit: Payer: Self-pay | Admitting: Neurology

## 2016-12-15 ENCOUNTER — Ambulatory Visit: Payer: Medicare Other | Admitting: Neurology

## 2017-01-12 ENCOUNTER — Ambulatory Visit (INDEPENDENT_AMBULATORY_CARE_PROVIDER_SITE_OTHER): Payer: Medicare Other | Admitting: Neurology

## 2017-01-12 ENCOUNTER — Encounter: Payer: Self-pay | Admitting: Neurology

## 2017-01-12 VITALS — BP 119/67 | HR 58 | Wt 253.4 lb

## 2017-01-12 DIAGNOSIS — G3184 Mild cognitive impairment, so stated: Secondary | ICD-10-CM

## 2017-01-12 MED ORDER — OXCARBAZEPINE 150 MG PO TABS: 150.0000 mg | ORAL_TABLET | Freq: Two times a day (BID) | ORAL | 0 refills | Status: DC

## 2017-01-12 NOTE — Patient Instructions (Signed)
I had a long discussion with the patient and his wife   regarding his remote seizures which appear quite stable and well controlled on Trileptal 150 mg twice daily which is tolerating well without side effects. I  advise him to continue it. He also has mild cognitive impairment which is stable Continue treatment of depression as per primary physician. I encouraged him to take fish oil as well as participate in cognitively challenging task like playing bridge, solving crossword puzzles, sudoku. I also asked him to consider possible participation in the CREAD trial if interested.He will return for follow-up in a year or call earlier if necessary.

## 2017-01-12 NOTE — Progress Notes (Signed)
[facility] [address] [place]. [place] [address] [phone]       OFFICE FOLLOW-UP NOTE  [name] Date of Birth:  December 19, 1946 Medical Record Number:  [id number]   HPI:  77 year patient with episodes of transient confusion possible complex partial seizures who has done well on low dose Trileptal Update 09/26/2014 : He returns for f/u after last visit 1 year ago. He is doing well without any sideeffects and tolerating tRILEPTAL 150 MG TWICE DAILY. hE HAD A ANNUAL PHYSICAL AND LAB WORK BY dR [name] LAST WEK BUT i DO NOT HAVE THOSE RECORDS TODAY TO REVIEW. hE HAS NOT HAD ANY SEIZURE LIKE EPISODES FOR SEVERAL YEARS.. hE HAS NO NEW COMPLAINTS Update 09/26/2013  He returns for follow up today after last visit 11/04/2012.He is doing well without any confusion or seizure like episodes.He remains on Trileptal 150 mg twice daily and is tolerating it well without side effects.He was found to have elevated PSA levels and underwent prostate biospy by Dr [name] recently but results are yet pending.He also has meniscal tear in left knee and is wearing a brace and undergone intra articular injections.he has no neurological complaints today. Update 09/27/2015 : She returns for follow-up after his last visit to year ago. He's had no further seizures. He is noted worsening of memory difficulties in the last 2 months. He has trouble moving dates he often misplaces objects in his possessions. He is otherwise quite independent and is driving and has never gotten lost. Patient was not cooperative for Mini-Mental status exam today as he states he cannot read and and has trouble completing the test. He does admit to significant stress in his home due to his wife's medical condition. Patient has remote history of depression and recently has been started on Lexapro by his primary physician. He does have family history of Alzheimer's on his mother's side. Update 12/07/2015 : He returns for  follow-up after last visit 2 months ago. Is a complaint by wife and daughter. They state that his memory difficulties remain about the same. He has been started on new medication for depression by his primary physician and depression seems somewhat improved. Continues to have short-term memory difficulties. He has not had any significant worsening and is able to handle his own affairs. He did undergo MRI scan of the brain yesterday on 12/06/15 which I personally reviewed shows mild nonspecific small vessel disease changes and generalized atrophy without acute abnormality. Lab work done on 09/27/15 showed normal vitamin B12, TSH and RPR. EEG study done on 12/6 at 16 personally reviewed by me was normal. Patient does take fish oil every day but he does not participate in cognitively challenging activities. He is not had a seizure now for several years but seems to tolerating Trileptal quite well. Update 01/12/2017 : He returns for follow-up after last visit 1 year ago. Is accompanied by his wife. He states his memory loss and cognitive difficulties appeared more or less the same. He has some good days and bad days and mostly struggles with the recent information. 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 Patient has not had any recurrent episodes to suggest complex partial seizures. Is tolerating Trileptal well without any side effects. He states his depression has been quite stable is on Celexa. His blood pressure is well controlled. He does not have new complaints. ROS:   14 system review  of systems is positive for   ringing in the ears, difficulty urinating, frequency of urination, joint and back pain, neck pain, daytime sleepiness, snoring, itching, easy bruising and bleeding, memory loss, depression and all other systems negative PMH:  Past Medical History:  Diagnosis Date  . ALLERGIC RHINITIS   . Anxiety and depression   . Back pain   .  Confusion    vs. complex partial seizures  . Degenerative disc disease   . DJD (degenerative joint disease)    neck  . Hyperlipemia   . Hypertension   . Memory loss   . Seizures (HCC)   . Sleep apnea    failed cpap    Social History:  Social History   Social History  . Marital status: Married    Spouse name: lynda  . Number of children: 0  . Years of education: 9h   Occupational History  . retired    Social History Main Topics  . Smoking status: Never Smoker  . Smokeless tobacco: Never Used  . Alcohol use No     Comment: Quit: 1990  . Drug use: No     Comment: Quit: 1990  . Sexual activity: Not on file   Other Topics Concern  . Not on file   Social History Narrative   Cut asbestos x 2 months around 1985.   Patient lives at home with his spouse.   Caffeine Use: 2-3 cans daily    Medications:   Current Outpatient Prescriptions on File Prior to Visit  Medication Sig Dispense Refill  . escitalopram (LEXAPRO) 20 MG tablet 1 TABLET ONCE A DAY ORALLY  1  . finasteride (PROSCAR) 5 MG tablet Take 5 mg by mouth daily.  3  . gabapentin (NEURONTIN) 800 MG tablet Take 800 mg by mouth 2 (two) times daily.     . metoprolol succinate (TOPROL-XL) 50 MG 24 hr tablet 1 TABLET ONCE A DAY ORALLY 30 DAY(S)  5  . oxyCODONE-acetaminophen (PERCOCET) 10-325 MG per tablet     . oxymetazoline (AFRIN NASAL SPRAY) 0.05 % nasal spray 2 sprays by Nasal route 3 (three) times daily.      . simvastatin (ZOCOR) 40 MG tablet   4  . tamsulosin (FLOMAX) 0.4 MG CAPS capsule      No current facility-administered medications on file prior to visit.     Allergies:  No Known Allergies Vitals:   01/12/17 1113  BP: 119/67  Pulse: (!) 58    Physical Exam General: well developed, well nourished middle aged Caucasian male , seated, in no evident distress Head: head normocephalic and atraumatic.    Neck: supple with no carotid or supraclavicular bruits Cardiovascular: regular rate and rhythm, no  murmurs Musculoskeletal: no deformity Skin:  no rash/petichiae Vascular:  Normal pulses all extremities Vitals:   01/12/17 1113  BP: 119/67  Pulse: (!) 58    Neurologic Exam Mental Status: Awake and fully alert. Oriented to place and time. Recent and remote memory intact. Attention span, concentration and fund of knowledge appropriate. Mood and affect appropriate. MMSE not done. Diminished recall 1/3.  Marland Kitchen Clock drawing 4/4. Animal naming test 10.   Cranial Nerves: Fundoscopic exam not done . Pupils equal, briskly reactive to light. Extraocular movements full without nystagmus. Visual fields full to confrontation. Hearing intact. Facial sensation intact. Face, tongue, palate moves normally and symmetrically.  Motor: Normal bulk and tone. Normal strength in all tested extremity muscles. Sensory.: intact to touch and pinprick and vibratory sensation.  Coordination: Rapid alternating movements normal in all extremities. Finger-to-nose and heel-to-shin performed accurately bilaterally. Gait and Station: Arises from chair without difficulty. Stance is normal. Gait demonstrates normal stride length and balance . Able to heel, toe and tandem walk with mild difficulty.  Reflexes: 1+ and symmetric. Toes downgoing.     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 New memory difficulties likely mild cognitive impairment with suboptimally treated depression    PLAN: I  had a long discussion with the patient and his wife   regarding his remote seizures which appear quite stable and well controlled on Trileptal 150 mg twice daily which is tolerating well without side effects. I  advise him to continue it. He also has mild cognitive impairment which is stable Continue treatment of depression as per primary physician. I encouraged him to take fish oil as well as participate in cognitively challenging task like  playing bridge, solving crossword puzzles, sudoku. I also asked him to consider possible participation in the CREAD trial if interested.greater than 50% time during this 25 minute visit was spent on counseling and coordination of care about memory loss and mild cognitive impairment and answering questions He will return for follow-up in a year or call earlier if necessary.  Delia HeadyPramod Latashia Koch, MD

## 2017-02-04 ENCOUNTER — Other Ambulatory Visit: Payer: Self-pay | Admitting: Neurology

## 2017-04-27 ENCOUNTER — Other Ambulatory Visit: Payer: Self-pay | Admitting: Neurology

## 2017-04-30 ENCOUNTER — Ambulatory Visit: Payer: Medicare Other | Admitting: Podiatry

## 2017-05-01 ENCOUNTER — Ambulatory Visit: Payer: Medicare Other | Admitting: Podiatry

## 2017-05-04 ENCOUNTER — Ambulatory Visit (INDEPENDENT_AMBULATORY_CARE_PROVIDER_SITE_OTHER): Payer: Medicare Other | Admitting: Podiatry

## 2017-05-04 ENCOUNTER — Encounter: Payer: Self-pay | Admitting: Podiatry

## 2017-05-04 VITALS — BP 164/70 | HR 70

## 2017-05-04 DIAGNOSIS — L84 Corns and callosities: Secondary | ICD-10-CM

## 2017-05-04 DIAGNOSIS — M79674 Pain in right toe(s): Secondary | ICD-10-CM

## 2017-05-04 DIAGNOSIS — L603 Nail dystrophy: Secondary | ICD-10-CM | POA: Diagnosis not present

## 2017-05-04 DIAGNOSIS — G8929 Other chronic pain: Secondary | ICD-10-CM

## 2017-05-04 NOTE — Patient Instructions (Signed)

## 2017-05-04 NOTE — Progress Notes (Signed)
Subjective:    Patient ID: [name], male   DOB: 71 y.o.   MRN: [id number]   HPI 71 year old male presents the office they for concerns of his right second and third toenails becoming thick and discolored and causing pain with pressure in shoes and he is asking for the toenails be removed today. He did other toenails removed and did well with that and so he'll at have these nails removed today due to the thickening which cause pain. He denies any swelling redness or drainage. He also gets a callus of the inside aspect of the right hallux. His wife to try to trim this but she did cut him to do this is asking if there is away to help prevent coming back. Denies any redness or drainage or any swelling from those areas. His no other complaints today.   Review of Systems  All other systems reviewed and are negative.       Objective:  Physical Exam General: AAO x3, NAD  Dermatological: The right second third digit toenail appears to be hypertrophic, dystrophic, discolored or tenderness to the right second third digit toenail. Tenderness the toenails there is no other areas of tenderness in the nails. On the medial aspect of the right hallux at the IPJ to hyperkeratotic lesion with evidence of dried blood under the callus. After debridement there is no pinpoint ulceration habitus is pre-ulcerative. There is no swelling erythema, drainage or pus or any clinical signs of infection.  Vascular: Dorsalis Pedis artery and Posterior Tibial artery pedal pulses are 2/4 bilateral with immedate capillary fill time.  There is no pain with calf compression, swelling, warmth, erythema.   Neruologic: Grossly intact via light touch bilateral. Vibratory intact via tuning fork bilateral. Protective threshold with Semmes Wienstein monofilament intact to all pedal sites bilateral. P  Musculoskeletal: No gross boney pedal deformities bilateral. No pain, crepitus, or limitation noted with foot and ankle range of  motion bilateral. Muscular strength 5/5 in all groups tested bilateral.  Gait: Unassisted, Nonantalgic.      Assessment:     71 year old male right foot symptomatic onychodystrophy right second third toes with pre-ulcerative callus right hallux    Plan:     -Treatment options discussed including all alternatives, risks, and complications -Etiology of symptoms were discussed At this time, the patient is requesting total nail removal with chemical matricectomy to the symptomatic portion of the nail. Risks and complications were discussed with the patient for which they understand and  verbally consent to the procedure. Under sterile conditions a total of 3 mL of a mixture of 2% lidocaine plain and 0.5% Marcaine plain was infiltrated in a digital block fashion to each digit. Once anesthetized, the skin was prepped in sterile fashion. A tourniquet was then applied. Next the right 2nd and 3rd digit toenails were then sharply excised making sure to remove the entire offending nail borders. Once the nails were ensured to be removed area was debrided and the underlying skin was intact. There is no purulence identified in the procedure. Next phenol was then applied under standard conditions and copiously irrigated. Silvadene was applied. A dry sterile dressing was applied. After application of the dressing the tourniquet was removed and there is found to be an immediate capillary refill time to the digit. The patient tolerated the procedure well any complications. Post procedure instructions were discussed the patient for which he verbally understood. Follow-up in one week for nail check or sooner if any problems are to  arise. Discussed signs/symptoms of infection and directed to call the office immediately should any occur or go directly to the emergency room. In the meantime, encouraged to call the office with any questions, concerns, changes symptoms. -Debrided the hyperkeratotic lesion right hallux.  Offloading pads dispensed. -Will check orthotic coverage at the request.  Ovid Curd, DPM

## 2017-05-18 ENCOUNTER — Ambulatory Visit (INDEPENDENT_AMBULATORY_CARE_PROVIDER_SITE_OTHER): Payer: Self-pay | Admitting: Podiatry

## 2017-05-18 ENCOUNTER — Encounter: Payer: Self-pay | Admitting: Podiatry

## 2017-05-18 DIAGNOSIS — L603 Nail dystrophy: Secondary | ICD-10-CM

## 2017-05-18 DIAGNOSIS — M79674 Pain in right toe(s): Secondary | ICD-10-CM

## 2017-05-18 DIAGNOSIS — G8929 Other chronic pain: Secondary | ICD-10-CM

## 2017-05-18 NOTE — Patient Instructions (Signed)

## 2017-05-21 NOTE — Progress Notes (Signed)
Subjective: [name] is a 71 y.o.  male returns to office today for follow up evaluation after having right 2nd and 3rd total  nail avulsion performed. He has not been soaking over the last couple days he has not been covering with a bandage. Denies any drainage or pus or any red streaks. Decrease tenderness the area. He reports. Patient denies fevers, chills, nausea, vomiting. Denies any calf pain, chest pain, SOB.   Objective:  Vitals: Reviewed  General: Well developed, nourished, in no acute distress, alert and oriented x3   Dermatology: Skin is warm, dry and supple bilateral. Right second and third nail bedappears to be clean, dry, with granular tissue and surrounding scab. There is faint surrounding erythema likely more from inflammation as opposed to infection and there is no edema, drainage/purulence. The remaining nails appear unremarkable at this time. There are no other lesions or other signs of infection present.  Neurovascular status: Intact. No lower extremity swelling; No pain with calf compression bilateral.  Musculoskeletal: Decreased tenderness to palpation of the nail folds. Muscular strength within normal limits bilateral.   Assesement and Plan: S/p partial nail avulsion, doing well.   -Continue soaking in epsom salts twice a day followed by antibiotic ointment and a band-aid. Can leave uncovered at night. Continue this until completely healed.  -If the area has not healed in 2 weeks, call the office for follow-up appointment, or sooner if any problems arise. -He will other toenails removed however discussed with him we will do this once these completely healed.  -Monitor for any signs/symptoms of infection. Call the office immediately if any occur or go directly to the emergency room. Call with any questions/concerns.  Ovid CurdMatthew Wagoner, DPM

## 2017-06-08 ENCOUNTER — Ambulatory Visit: Payer: Medicare Other | Admitting: Podiatry

## 2017-06-18 ENCOUNTER — Ambulatory Visit: Payer: Medicare Other | Admitting: Podiatry

## 2018-01-13 ENCOUNTER — Other Ambulatory Visit: Payer: Self-pay

## 2018-01-13 ENCOUNTER — Ambulatory Visit: Payer: Medicare Other | Admitting: Neurology

## 2018-01-13 ENCOUNTER — Telehealth: Payer: Self-pay

## 2018-01-13 MED ORDER — OXCARBAZEPINE 150 MG PO TABS: 150.0000 mg | ORAL_TABLET | Freq: Two times a day (BID) | ORAL | 1 refills | Status: AC

## 2018-01-13 NOTE — Telephone Encounter (Signed)
Patient no show for appt today. 

## 2018-01-14 ENCOUNTER — Encounter: Payer: Self-pay | Admitting: Neurology

## 2019-09-14 ENCOUNTER — Encounter (HOSPITAL_COMMUNITY): Payer: Self-pay | Admitting: Emergency Medicine

## 2019-09-14 ENCOUNTER — Other Ambulatory Visit: Payer: Self-pay

## 2019-09-14 ENCOUNTER — Emergency Department (HOSPITAL_COMMUNITY)
Admission: EM | Admit: 2019-09-14 | Discharge: 2019-09-15 | Disposition: A | Discharge status: Home / Self Care | Payer: Medicare Other | Attending: Emergency Medicine | Admitting: Emergency Medicine

## 2019-09-14 DIAGNOSIS — R609 Edema, unspecified: Secondary | ICD-10-CM | POA: Diagnosis not present

## 2019-09-14 DIAGNOSIS — L0390 Cellulitis, unspecified: Secondary | ICD-10-CM | POA: Diagnosis present

## 2019-09-14 DIAGNOSIS — I10 Essential (primary) hypertension: Secondary | ICD-10-CM | POA: Diagnosis not present

## 2019-09-14 DIAGNOSIS — M79662 Pain in left lower leg: Secondary | ICD-10-CM

## 2019-09-14 DIAGNOSIS — Z79899 Other long term (current) drug therapy: Secondary | ICD-10-CM | POA: Diagnosis not present

## 2019-09-14 LAB — CBC WITH DIFFERENTIAL/PLATELET
Abs Immature Granulocytes: 0.02 10*3/uL (ref 0.00–0.07)
Basophils Absolute: 0.1 10*3/uL (ref 0.0–0.1)
Basophils Relative: 1 %
Eosinophils Absolute: 0.5 10*3/uL (ref 0.0–0.5)
Eosinophils Relative: 7 %
HCT: 35.7 % — ABNORMAL LOW (ref 39.0–52.0)
Hemoglobin: 11.5 g/dL — ABNORMAL LOW (ref 13.0–17.0)
Immature Granulocytes: 0 %
Lymphocytes Relative: 23 %
Lymphs Abs: 1.6 10*3/uL (ref 0.7–4.0)
MCH: 31.5 pg (ref 26.0–34.0)
MCHC: 32.2 g/dL (ref 30.0–36.0)
MCV: 97.8 fL (ref 80.0–100.0)
Monocytes Absolute: 0.8 10*3/uL (ref 0.1–1.0)
Monocytes Relative: 11 %
Neutro Abs: 4.1 10*3/uL (ref 1.7–7.7)
Neutrophils Relative %: 58 %
Platelets: 201 10*3/uL (ref 150–400)
RBC: 3.65 MIL/uL — ABNORMAL LOW (ref 4.22–5.81)
RDW: 13.2 % (ref 11.5–15.5)
WBC: 7.1 10*3/uL (ref 4.0–10.5)
nRBC: 0 % (ref 0.0–0.2)

## 2019-09-14 NOTE — ED Triage Notes (Signed)
Patient reports worsening cellulitis at left shin with redness/mild selling , denies injury, no drainage . No fever or chills .

## 2019-09-15 ENCOUNTER — Emergency Department (HOSPITAL_COMMUNITY): Payer: Medicare Other

## 2019-09-15 ENCOUNTER — Emergency Department (HOSPITAL_BASED_OUTPATIENT_CLINIC_OR_DEPARTMENT_OTHER): Payer: Medicare Other

## 2019-09-15 DIAGNOSIS — M7989 Other specified soft tissue disorders: Secondary | ICD-10-CM | POA: Diagnosis not present

## 2019-09-15 LAB — COMPREHENSIVE METABOLIC PANEL
ALT: 25 U/L (ref 0–44)
AST: 36 U/L (ref 15–41)
Albumin: 3.2 g/dL — ABNORMAL LOW (ref 3.5–5.0)
Alkaline Phosphatase: 67 U/L (ref 38–126)
Anion gap: 7 (ref 5–15)
BUN: 19 mg/dL (ref 8–23)
CO2: 23 mmol/L (ref 22–32)
Calcium: 8.7 mg/dL — ABNORMAL LOW (ref 8.9–10.3)
Chloride: 109 mmol/L (ref 98–111)
Creatinine, Ser: 1.2 mg/dL (ref 0.61–1.24)
GFR calc Af Amer: >60 mL/min (ref >60)
GFR calc non Af Amer: 60 mL/min — ABNORMAL LOW (ref >60)
Glucose, Bld: 123 mg/dL — ABNORMAL HIGH (ref 70–99)
Potassium: 3.9 mmol/L (ref 3.5–5.1)
Sodium: 139 mmol/L (ref 135–145)
Total Bilirubin: 0.4 mg/dL (ref 0.3–1.2)
Total Protein: 6.2 g/dL — ABNORMAL LOW (ref 6.5–8.1)

## 2019-09-15 MED ORDER — DOXYCYCLINE HYCLATE 100 MG PO CAPS: 100.0000 mg | ORAL_CAPSULE | Freq: Two times a day (BID) | ORAL | 0 refills | Status: AC

## 2019-09-15 NOTE — Progress Notes (Signed)
Bilateral lower extremity venous duplex completed. Preliminary results in Chart review CV Proc. [facility], RVS 09/15/2019, 1:32 PM

## 2019-09-15 NOTE — Discharge Instructions (Addendum)
You were seen in the ER for swelling to your legs, worse on the left side.  Ultrasound of your veins did not reveal a blood clot in either side.  Given the swelling and some tenderness to the area on the left side and some scabs on the skin, we will treat you for early cellulitis which is a skin infection.  Take doxycycline as prescribed.  Elevate your feet.  Alternate acetaminophen (Tylenol) and ibuprofen (ibuprofen) every 6-8 hours for any associated pain.  Follow-up with your primary care doctor in the next 7 to 14 days for evaluation of your ongoing leg swelling.  This could be from venous stasis which is slowing and sluggish veins or other problems, you want to make sure that this is not worsening so contact your doctor for reevaluation.  Return to the ER for worsening swelling, redness, warmth, fevers, chest pain, shortness of breath or any other body swelling.

## 2019-09-15 NOTE — ED Provider Notes (Signed)
[facility] EMERGENCY DEPARTMENT Provider Note   CSN: [id number] Arrival date & time: 09/14/19  2253     History   Chief Complaint Chief Complaint  Patient presents with   Cellulitis    HPI [name] is a 73 y.o. male presents to the ER for evaluation of left leg swelling for the last 2 days.  Patient states he has bilateral leg swelling, intermittent, worse at nighttime for several months.  Over the last 2 days he noticed his left leg has worsened compared to usual.  Typically his left leg does swell more than the right but states this is worse.  He denies any other associated symptoms including redness, warmth, pain, tingling.  No recent trauma or falls or injury to the area.  No previous history of injuries or surgeries to the left lower extremity.  No interventions.  No alleviating factors.  He has several superficial wounds to his legs bilaterally states that these have been there for years and attributes it to working in the yard. No h/o active cancer, recent surgery, prolonged immobilization or travel, h/o DVT/PE, hemoptysis, hormone therapy. No chest pain, shortness of breath.      HPI  Past Medical History:  Diagnosis Date   ALLERGIC RHINITIS    Anxiety and depression    Back pain    Confusion    vs. complex partial seizures   Degenerative disc disease    DJD (degenerative joint disease)    neck   Hyperlipemia    Hypertension    Memory loss    Seizures (HCC)    Sleep apnea    failed cpap    Patient Active Problem List   Diagnosis Date Noted   Memory loss 09/27/2015   MCI (mild cognitive impairment) 09/27/2015   Depression 09/27/2015   Acute confusional state 09/26/2013   Localization-related (focal) (partial) epilepsy and epileptic syndromes with complex partial seizures, without mention of intractable epilepsy 09/26/2013   HISTORY OF ASBESTOS EXPOSURE 08/22/2008   OBSTRUCTIVE SLEEP APNEA 01/10/2008   ALLERGIC  RHINITIS 01/10/2008   NASAL FRACTURE 01/10/2008   BEHAVIOR PROBLEM 01/10/2008    Past Surgical History:  Procedure Laterality Date   APPENDECTOMY     CERVICAL SPINE SURGERY     rods, 3 other fusion procedures   LUMBAR SPINE SURGERY     ROTATOR CUFF REPAIR     bilateral   THORACIC SPINE SURGERY     TOTAL KNEE ARTHROPLASTY     right   UVULOPALATOPHARYNGOPLASTY          Home Medications    Prior to Admission medications   Medication Sig Start Date End Date Taking? Authorizing Provider  ARIPiprazole (ABILIFY) 2 MG tablet TAKE 1-2 TABLETS ONCE A DAY ORALLY 30 DAY(S) 10/12/16   [provider]  clobetasol cream (TEMOVATE) 0.05 %  04/27/17   [provider]  doxycycline (VIBRAMYCIN) 100 MG capsule Take 1 capsule (100 mg total) by mouth 2 (two) times daily. 09/15/19   Liberty Handy, PA-C  escitalopram (LEXAPRO) 20 MG tablet 1 TABLET ONCE A DAY ORALLY 09/17/15   [provider]  finasteride (PROSCAR) 5 MG tablet Take 5 mg by mouth daily. 07/04/15   [provider]  fluocinonide (LIDEX) 0.05 % external solution  05/01/17   [provider]  folic acid (FOLVITE) 1 MG tablet Take 1 mg by mouth daily. 12/24/16   [provider]  gabapentin (NEURONTIN) 600 MG tablet Take 600 mg by mouth every  6 (six) hours. 04/02/17   [provider]  lisinopril-hydrochlorothiazide (PRINZIDE,ZESTORETIC) 20-12.5 MG tablet 2 TABLETS ONCE A DAY ORALLY 90 DAYS 10/26/16   [provider]  methotrexate (RHEUMATREX) 2.5 MG tablet 4 (four) times a week.  01/08/17   [provider]  metoprolol succinate (TOPROL-XL) 50 MG 24 hr tablet 1 TABLET ONCE A DAY ORALLY 30 DAY(S) 09/19/15   [provider]  OXcarbazepine (TRILEPTAL) 150 MG tablet Take 1 tablet (150 mg total) by mouth 2 (two) times daily. 01/13/18   Micki RileySethi, Pramod S, MD  oxyCODONE-acetaminophen (PERCOCET) 10-325 MG per tablet  09/25/13   [provider]    oxymetazoline (AFRIN NASAL SPRAY) 0.05 % nasal spray 2 sprays by Nasal route 3 (three) times daily.      [provider]  simvastatin (ZOCOR) 40 MG tablet  09/26/15   [provider]  tamsulosin (FLOMAX) 0.4 MG CAPS capsule  09/08/13   [provider]  triamcinolone cream (KENALOG) 0.1 %  11/12/16   [provider]  venlafaxine XR (EFFEXOR-XR) 150 MG 24 hr capsule  12/07/16   [provider]    Family History Family History  Problem Relation Age of Onset   Alcohol abuse Mother    Alcohol abuse Father    Arthritis Other    Clotting disorder Other     Social History Social History   Tobacco Use   Smoking status: Never Smoker   Smokeless tobacco: Never Used  Substance Use Topics   Alcohol use: No    Comment: Quit: 1990   Drug use: No    Comment: Quit: 1990     Allergies   Patient has no known allergies.   Review of Systems Review of Systems  Cardiovascular: Positive for leg swelling.  All other systems reviewed and are negative.    Physical Exam Updated Vital Signs BP (!) 166/71    Pulse (!) 57    Temp 98 F (36.7 C) (Oral)    Resp 18    SpO2 99%   Physical Exam Vitals signs and nursing note reviewed.  Constitutional:      General: He is not in acute distress.    Appearance: He is well-developed.     Comments: NAD.  HENT:     Head: Normocephalic and atraumatic.     Right Ear: External ear normal.     Left Ear: External ear normal.     Nose: Nose normal.  Eyes:     General: No scleral icterus.    Conjunctiva/sclera: Conjunctivae normal.  Neck:     Musculoskeletal: Normal range of motion and neck supple.  Cardiovascular:     Rate and Rhythm: Normal rate and regular rhythm.     Pulses:          Femoral pulses are 1+ on the right side and 1+ on the left side.      Popliteal pulses are 1+ on the right side and 1+ on the left side.       Dorsalis pedis pulses are 2+ on the right side and 1+ on the left  side.       Posterior tibial pulses are 1+ on the right side and 1+ on the left side.     Heart sounds: Normal heart sounds.     Comments: Left DP pulse is diminished but palpable, confirmed with doppler. Slight asymmetric edema L>R up to mid tib/fib compared to left.  Left calf with mild tenderness. Toes and feet well perfused.  Pulmonary:     Effort: Pulmonary effort is normal.     Breath sounds: Normal breath sounds.  Musculoskeletal: Normal range of motion.     Right lower leg: 1+ Pitting Edema present.     Left lower leg: 1+ Pitting Edema present.  Skin:    General: Skin is warm and dry.     Capillary Refill: Capillary refill takes less than 2 seconds.     Comments: Several old appearing superficial circular lesions with scabs over them to bilateral lower extremities and upper extremities without focal edema, erythema, warmth, tenderness, drainage.  No track marks.  Subtle erythema to LLE but no warmth or tenderness.   Neurological:     Mental Status: He is alert and oriented to person, place, and time.     Comments: Sensation and strength intact in lower extremities and ankles   Psychiatric:        Behavior: Behavior normal.        Thought Content: Thought content normal.        Judgment: Judgment normal.      ED Treatments / Results  Labs (all labs ordered are listed, but only abnormal results are displayed) Labs Reviewed  CBC WITH DIFFERENTIAL/PLATELET - Abnormal; Notable for the following components:      Result Value   RBC 3.65 (*)    Hemoglobin 11.5 (*)    HCT 35.7 (*)    All other components within normal limits  COMPREHENSIVE METABOLIC PANEL - Abnormal; Notable for the following components:   Glucose, Bld 123 (*)    Calcium 8.7 (*)    Total Protein 6.2 (*)    Albumin 3.2 (*)    GFR calc non Af Amer 60 (*)    All other components within normal limits    EKG None  Radiology Vas [place] Lower Extremity Venous (dvt) (mc And Wl 7a-7p)  Result Date: 09/15/2019   Lower Venous Study Indications: Swelling. Other Indications: Patient reports worsening cellulitis at left shin with                    redness and mild swelling. Comparison Study: No priors. Performing Technologist: Toma Deiters RVS  Examination Guidelines: A complete evaluation includes B-mode imaging, spectral Doppler, color Doppler, and power Doppler as needed of all accessible portions of each vessel. Bilateral testing is considered an integral part of a complete examination. Limited examinations for reoccurring indications may be performed as noted.  +---------+---------------+---------+-----------+----------+--------------+  RIGHT     Compressibility Phasicity Spontaneity Properties Thrombus Aging  +---------+---------------+---------+-----------+----------+--------------+  CFV       Full            Yes       Yes                                    +---------+---------------+---------+-----------+----------+--------------+  SFJ       Full                                                             +---------+---------------+---------+-----------+----------+--------------+  FV Prox   Full                                                             +---------+---------------+---------+-----------+----------+--------------+  FV Mid    Full                                                             +---------+---------------+---------+-----------+----------+--------------+  FV Distal Full                                                             +---------+---------------+---------+-----------+----------+--------------+  PFV       Full                                                             +---------+---------------+---------+-----------+----------+--------------+  POP       Full            Yes       Yes                                    +---------+---------------+---------+-----------+----------+--------------+  PTV       Full                                                              +---------+---------------+---------+-----------+----------+--------------+  PERO      Full                                                             +---------+---------------+---------+-----------+----------+--------------+   +---------+---------------+---------+-----------+----------+--------------+  LEFT      Compressibility Phasicity Spontaneity Properties Thrombus Aging  +---------+---------------+---------+-----------+----------+--------------+  CFV       Full            Yes       Yes                                    +---------+---------------+---------+-----------+----------+--------------+  SFJ       Full                                                             +---------+---------------+---------+-----------+----------+--------------+  FV Prox   Full                                                             +---------+---------------+---------+-----------+----------+--------------+    FV Mid    Full                                                             +---------+---------------+---------+-----------+----------+--------------+  FV Distal Full                                                             +---------+---------------+---------+-----------+----------+--------------+  PFV       Full                                                             +---------+---------------+---------+-----------+----------+--------------+  POP       Full            Yes       Yes                                    +---------+---------------+---------+-----------+----------+--------------+  PTV       Full                                                             +---------+---------------+---------+-----------+----------+--------------+  PERO      Full                                                             +---------+---------------+---------+-----------+----------+--------------+   Summary: Right: There is no evidence of deep vein thrombosis in the lower extremity. No cystic structure found in the  popliteal fossa. Left: There is no evidence of deep vein thrombosis in the lower extremity. No cystic structure found in the popliteal fossa.  *See table(s) above for measurements and observations.    Preliminary    Vas [place] Lower Extremity Venous (dvt) (only Mc & Wl 7a-7p)  Result Date: 09/15/2019  Lower Venous Study Indications: Swelling. Other Indications: Patient reports worsening cellulitis at left shin with                    redness and mild swelling. Comparison Study: No priors. Performing Technologist: Toma DeitersVirginia Slaughter RVS  Examination Guidelines: A complete evaluation includes B-mode imaging, spectral Doppler, color Doppler, and power Doppler as needed of all accessible portions of each vessel. Bilateral testing is considered an integral part of a complete examination. Limited examinations for reoccurring indications may be performed as noted.  +---------+---------------+---------+-----------+----------+--------------+  RIGHT     Compressibility Phasicity Spontaneity Properties Thrombus Aging  +---------+---------------+---------+-----------+----------+--------------+  CFV       Full  Yes       Yes                                    +---------+---------------+---------+-----------+----------+--------------+  SFJ       Full                                                             +---------+---------------+---------+-----------+----------+--------------+  FV Prox   Full                                                             +---------+---------------+---------+-----------+----------+--------------+  FV Mid    Full                                                             +---------+---------------+---------+-----------+----------+--------------+  FV Distal Full                                                             +---------+---------------+---------+-----------+----------+--------------+  PFV       Full                                                              +---------+---------------+---------+-----------+----------+--------------+  POP       Full            Yes       Yes                                    +---------+---------------+---------+-----------+----------+--------------+  PTV       Full                                                             +---------+---------------+---------+-----------+----------+--------------+  PERO      Full                                                             +---------+---------------+---------+-----------+----------+--------------+   +---------+---------------+---------+-----------+----------+--------------+  LEFT      Compressibility Phasicity  Spontaneity Properties Thrombus Aging  +---------+---------------+---------+-----------+----------+--------------+  CFV       Full            Yes       Yes                                    +---------+---------------+---------+-----------+----------+--------------+  SFJ       Full                                                             +---------+---------------+---------+-----------+----------+--------------+  FV Prox   Full                                                             +---------+---------------+---------+-----------+----------+--------------+  FV Mid    Full                                                             +---------+---------------+---------+-----------+----------+--------------+  FV Distal Full                                                             +---------+---------------+---------+-----------+----------+--------------+  PFV       Full                                                             +---------+---------------+---------+-----------+----------+--------------+  POP       Full            Yes       Yes                                    +---------+---------------+---------+-----------+----------+--------------+  PTV       Full                                                              +---------+---------------+---------+-----------+----------+--------------+  PERO      Full                                                             +---------+---------------+---------+-----------+----------+--------------+  Summary: Right: There is no evidence of deep vein thrombosis in the lower extremity. No cystic structure found in the popliteal fossa. Left: There is no evidence of deep vein thrombosis in the lower extremity. No cystic structure found in the popliteal fossa.  *See table(s) above for measurements and observations.    Preliminary     Procedures Procedures (including critical care time)  Medications Ordered in ED Medications - No data to display   Initial Impression / Assessment and Plan / ED Course  I have reviewed the triage vital signs and the nursing notes.  Pertinent labs & imaging results that were available during my care of the patient were reviewed by me and considered in my medical decision making (see chart for details).  Clinical Course as of Sep 14 1026  Thu Sep 15, 2019  0707 Hemoglobin(!): 11.5 [CG]  0833 LLE vascular US negative    [CG]    Clinical Course User Index [CG] Liberty Handy, PA-C   Exam reveals mild pitting edema bilaterally, slightly asymmetric left greater than right with left calf tenderness.  Slightly diminished DP pulse on left but palpable and confirmed with dopplers, more proximal pulses palpable.  He has no risk factors for DVT but we will obtain bilateral vascular ultrasounds here.  Vascular US negative.  Given mild asymmetric edema, tenderness and wounds to the area low threshold to treat for early cellulitis.  Will dc with doxycyline, elevation, NSAID, close f/u with PCP. No signs of limb ischemia. Discussed with patient his chronic peripheral edema likely from venous stasis but recommended follow up with PCP to ensure these don't get worse. Recommended elevation. Return precautions such as Cp, SOB, cough, worsening  peripheral edema, fevers discussed. Shared with EDP.  Final Clinical Impressions(s) / ED Diagnoses   Final diagnoses:  Peripheral edema  Pain of left calf    ED Discharge Orders         Ordered    doxycycline (VIBRAMYCIN) 100 MG capsule  2 times daily     09/15/19 0942           Liberty Handy, PA-C 09/15/19 1027    Alvira Monday, MD 09/15/19 2158

## 2019-11-12 ENCOUNTER — Encounter (HOSPITAL_COMMUNITY): Payer: Self-pay | Admitting: Emergency Medicine

## 2019-11-12 ENCOUNTER — Emergency Department (HOSPITAL_COMMUNITY): Payer: Medicare Other

## 2019-11-12 ENCOUNTER — Other Ambulatory Visit: Payer: Self-pay

## 2019-11-12 ENCOUNTER — Emergency Department (HOSPITAL_COMMUNITY)
Admission: EM | Admit: 2019-11-12 | Discharge: 2019-11-12 | Disposition: A | Discharge status: Home / Self Care | Payer: Medicare Other | Attending: Emergency Medicine | Admitting: Emergency Medicine

## 2019-11-12 DIAGNOSIS — G40209 Localization-related (focal) (partial) symptomatic epilepsy and epileptic syndromes with complex partial seizures, not intractable, without status epilepticus: Secondary | ICD-10-CM | POA: Diagnosis not present

## 2019-11-12 DIAGNOSIS — J069 Acute upper respiratory infection, unspecified: Secondary | ICD-10-CM | POA: Insufficient documentation

## 2019-11-12 DIAGNOSIS — Z79899 Other long term (current) drug therapy: Secondary | ICD-10-CM | POA: Diagnosis not present

## 2019-11-12 DIAGNOSIS — I10 Essential (primary) hypertension: Secondary | ICD-10-CM | POA: Insufficient documentation

## 2019-11-12 DIAGNOSIS — R05 Cough: Secondary | ICD-10-CM | POA: Diagnosis present

## 2019-11-12 LAB — CBC WITH DIFFERENTIAL/PLATELET
Abs Immature Granulocytes: 0.01 10*3/uL (ref 0.00–0.07)
Basophils Absolute: 0 10*3/uL (ref 0.0–0.1)
Basophils Relative: 0 %
Eosinophils Absolute: 0.3 10*3/uL (ref 0.0–0.5)
Eosinophils Relative: 8 %
HCT: 40.7 % (ref 39.0–52.0)
Hemoglobin: 13.8 g/dL (ref 13.0–17.0)
Immature Granulocytes: 0 %
Lymphocytes Relative: 21 %
Lymphs Abs: 0.8 10*3/uL (ref 0.7–4.0)
MCH: 31.3 pg (ref 26.0–34.0)
MCHC: 33.9 g/dL (ref 30.0–36.0)
MCV: 92.3 fL (ref 80.0–100.0)
Monocytes Absolute: 0.6 10*3/uL (ref 0.1–1.0)
Monocytes Relative: 16 %
Neutro Abs: 2 10*3/uL (ref 1.7–7.7)
Neutrophils Relative %: 55 %
Platelets: 174 10*3/uL (ref 150–400)
RBC: 4.41 MIL/uL (ref 4.22–5.81)
RDW: 13.4 % (ref 11.5–15.5)
WBC: 3.6 10*3/uL — ABNORMAL LOW (ref 4.0–10.5)
nRBC: 0 % (ref 0.0–0.2)

## 2019-11-12 LAB — BASIC METABOLIC PANEL
Anion gap: 10 (ref 5–15)
BUN: 9 mg/dL (ref 8–23)
CO2: 22 mmol/L (ref 22–32)
Calcium: 8.5 mg/dL — ABNORMAL LOW (ref 8.9–10.3)
Chloride: 107 mmol/L (ref 98–111)
Creatinine, Ser: 0.85 mg/dL (ref 0.61–1.24)
GFR calc Af Amer: >60 mL/min (ref >60)
GFR calc non Af Amer: >60 mL/min (ref >60)
Glucose, Bld: 100 mg/dL — ABNORMAL HIGH (ref 70–99)
Potassium: 3.2 mmol/L — ABNORMAL LOW (ref 3.5–5.1)
Sodium: 139 mmol/L (ref 135–145)

## 2019-11-12 MED ORDER — FENTANYL CITRATE (PF) 100 MCG/2ML IJ SOLN
50.0000 ug | Freq: Once | INTRAMUSCULAR | Status: AC
  Administered 2019-11-12: 19:00:00 50 ug via INTRAVENOUS
  Filled 2019-11-12: qty 2

## 2019-11-12 MED ORDER — SODIUM CHLORIDE 0.9 % IV BOLUS
500.0000 mL | Freq: Once | INTRAVENOUS | Status: AC
  Administered 2019-11-12: 500 mL via INTRAVENOUS

## 2019-11-12 NOTE — ED Triage Notes (Signed)
C/o fever, headache, and productive cough with clear sputum x 2 days.

## 2019-11-12 NOTE — ED Provider Notes (Signed)
[facility] EMERGENCY DEPARTMENT Provider Note   CSN: [id number] Arrival date & time: 11/12/19  1707     History   Chief Complaint Chief Complaint  Patient presents with  . Cough  . Fever  . Headache    HPI [name] is a 73 y.o. male.     Patient is a 73 year old male with a history of hypertension, hyperlipidemia, seizures and chronic neck and back pain who presents with flulike symptoms.  He states he is felt bad for the last 2 to 3 days with achiness all over and subjective fevers with chills.  He also has a headache which has been constant since it started a few days ago.  He describes it as a achy pain in his bifrontal area which also goes to the back of his head.  He states he has similar headaches a couple times a month in the back of his head in the frontal area although usually goes away with Tylenol and it has not gone away this time.  It does not radiate down his spine.  He does have some chronic neck pain which he says is contributing to his headache.  He reports a cough with some congestion and occasional posttussive emesis.  He denies any shortness of breath.  He does have some loose stools.  No rashes.  He says that he is aching in all his muscles.  He has 2 other friends who have been similarly sick and are currently in the hospital.  He does not know if it was Covid or not.  He denies any shortness of breath or chest pain other than the achiness all over in his muscles.     Past Medical History:  Diagnosis Date  . ALLERGIC RHINITIS   . Anxiety and depression   . Back pain   . Confusion    vs. complex partial seizures  . Degenerative disc disease   . DJD (degenerative joint disease)    neck  . Hyperlipemia   . Hypertension   . Memory loss   . Seizures (HCC)   . Sleep apnea    failed cpap    Patient Active Problem List   Diagnosis Date Noted  . Memory loss 09/27/2015  . MCI (mild cognitive impairment) 09/27/2015  . Depression  09/27/2015  . Acute confusional state 09/26/2013  . Localization-related (focal) (partial) epilepsy and epileptic syndromes with complex partial seizures, without mention of intractable epilepsy 09/26/2013  . HISTORY OF ASBESTOS EXPOSURE 08/22/2008  . OBSTRUCTIVE SLEEP APNEA 01/10/2008  . ALLERGIC RHINITIS 01/10/2008  . NASAL FRACTURE 01/10/2008  . BEHAVIOR PROBLEM 01/10/2008    Past Surgical History:  Procedure Laterality Date  . APPENDECTOMY    . CERVICAL SPINE SURGERY     rods, 3 other fusion procedures  . LUMBAR SPINE SURGERY    . ROTATOR CUFF REPAIR     bilateral  . THORACIC SPINE SURGERY    . TOTAL KNEE ARTHROPLASTY     right  . UVULOPALATOPHARYNGOPLASTY          Home Medications    Prior to Admission medications   Medication Sig Start Date End Date Taking? Authorizing Provider  ARIPiprazole (ABILIFY) 2 MG tablet TAKE 1-2 TABLETS ONCE A DAY ORALLY 30 DAY(S) 10/12/16   [provider]  clobetasol cream (TEMOVATE) 0.05 %  04/27/17   [provider]  doxycycline (VIBRAMYCIN) 100 MG capsule Take 1 capsule (100 mg total) by mouth 2 (two) times daily. 09/15/19  Liberty Handy, PA-C  escitalopram (LEXAPRO) 20 MG tablet 1 TABLET ONCE A DAY ORALLY 09/17/15   [provider]  finasteride (PROSCAR) 5 MG tablet Take 5 mg by mouth daily. 07/04/15   [provider]  fluocinonide (LIDEX) 0.05 % external solution  05/01/17   [provider]  folic acid (FOLVITE) 1 MG tablet Take 1 mg by mouth daily. 12/24/16   [provider]  gabapentin (NEURONTIN) 600 MG tablet Take 600 mg by mouth every 6 (six) hours. 04/02/17   [provider]  lisinopril-hydrochlorothiazide (PRINZIDE,ZESTORETIC) 20-12.5 MG tablet 2 TABLETS ONCE A DAY ORALLY 90 DAYS 10/26/16   [provider]  methotrexate (RHEUMATREX) 2.5 MG tablet 4 (four) times a week.  01/08/17   [provider]  metoprolol succinate (TOPROL-XL) 50 MG 24 hr tablet 1  TABLET ONCE A DAY ORALLY 30 DAY(S) 09/19/15   [provider]  OXcarbazepine (TRILEPTAL) 150 MG tablet Take 1 tablet (150 mg total) by mouth 2 (two) times daily. 01/13/18   Micki Riley, MD  oxyCODONE-acetaminophen (PERCOCET) 10-325 MG per tablet  09/25/13   [provider]  oxymetazoline (AFRIN NASAL SPRAY) 0.05 % nasal spray 2 sprays by Nasal route 3 (three) times daily.      [provider]  simvastatin (ZOCOR) 40 MG tablet  09/26/15   [provider]  tamsulosin (FLOMAX) 0.4 MG CAPS capsule  09/08/13   [provider]  triamcinolone cream (KENALOG) 0.1 %  11/12/16   [provider]  venlafaxine XR (EFFEXOR-XR) 150 MG 24 hr capsule  12/07/16   [provider]    Family History Family History  Problem Relation Age of Onset  . Alcohol abuse Mother   . Alcohol abuse Father   . Arthritis Other   . Clotting disorder Other     Social History Social History   Tobacco Use  . Smoking status: Never Smoker  . Smokeless tobacco: Never Used  Substance Use Topics  . Alcohol use: No    Comment: Quit: 1990  . Drug use: No    Comment: Quit: 1990     Allergies   Patient has no known allergies.   Review of Systems Review of Systems  Constitutional: Positive for chills and fatigue. Negative for diaphoresis and fever (Subjective).  HENT: Positive for congestion and rhinorrhea. Negative for sneezing.   Eyes: Negative.   Respiratory: Positive for cough. Negative for chest tightness and shortness of breath.   Cardiovascular: Negative for chest pain and leg swelling.  Gastrointestinal: Positive for diarrhea and vomiting (Posttussive). Negative for abdominal pain, blood in stool and nausea.  Genitourinary: Negative for difficulty urinating, flank pain, frequency and hematuria.  Musculoskeletal: Positive for myalgias and neck pain. Negative for arthralgias and back pain.  Skin: Negative for rash.  Neurological: Positive for  headaches. Negative for dizziness, speech difficulty, weakness and numbness.     Physical Exam Updated Vital Signs BP (!) 148/72   Pulse 74   Temp 98.2 F (36.8 C) (Oral)   Resp 18   SpO2 100%   Physical Exam Constitutional:      Appearance: He is well-developed.  HENT:     Head: Normocephalic and atraumatic.  Eyes:     Pupils: Pupils are equal, round, and reactive to light.  Neck:     Musculoskeletal: Normal range of motion and neck supple.     Comments: No meningismus Cardiovascular:     Rate and Rhythm: Normal rate and regular rhythm.  Heart sounds: Normal heart sounds.  Pulmonary:     Effort: Pulmonary effort is normal. No respiratory distress.     Breath sounds: Normal breath sounds. No wheezing or rales.  Chest:     Chest wall: No tenderness.  Abdominal:     General: Bowel sounds are normal.     Palpations: Abdomen is soft.     Tenderness: There is no abdominal tenderness. There is no guarding or rebound.  Musculoskeletal: Normal range of motion.  Lymphadenopathy:     Cervical: No cervical adenopathy.  Skin:    General: Skin is warm and dry.     Findings: No rash.  Neurological:     Mental Status: He is alert and oriented to person, place, and time.     Cranial Nerves: No cranial nerve deficit.     Sensory: No sensory deficit.     Motor: No weakness.      ED Treatments / Results  Labs (all labs ordered are listed, but only abnormal results are displayed) Labs Reviewed  BASIC METABOLIC PANEL - Abnormal; Notable for the following components:      Result Value   Potassium 3.2 (*)    Glucose, Bld 100 (*)    Calcium 8.5 (*)    All other components within normal limits  CBC WITH DIFFERENTIAL/PLATELET - Abnormal; Notable for the following components:   WBC 3.6 (*)    All other components within normal limits  SARS CORONAVIRUS 2 (TAT 6-24 HRS)    EKG None  Radiology Dg Chest Port 1 View  Result Date: 11/12/2019 CLINICAL DATA:  Cough EXAM:  PORTABLE CHEST 1 VIEW COMPARISON:  04/21/2007, 07/19/2009 FINDINGS: Partially visualized hardware in the cervical spine. Mild chronic bronchitic changes. No acute airspace disease or effusion. Borderline cardiomegaly. Aortic atherosclerosis. No pneumothorax. IMPRESSION: No active disease. Electronically Signed   By: Donavan Foil M.D.   On: 11/12/2019 19:10    Procedures Procedures (including critical care time)  Medications Ordered in ED Medications  sodium chloride 0.9 % bolus 500 mL (0 mLs Intravenous Stopped 11/12/19 2005)  fentaNYL (SUBLIMAZE) injection 50 mcg (50 mcg Intravenous Given 11/12/19 1855)     Initial Impression / Assessment and Plan / ED Course  I have reviewed the triage vital signs and the nursing notes.  Pertinent labs & imaging results that were available during my care of the patient were reviewed by me and considered in my medical decision making (see chart for details).        Patient is a 73 year old male who presents with subjective fevers, cough, myalgias and headache.  He has 2 close contacts who are currently admitted for similar symptoms.  I suspect it is Covid although he does not know this.  His chest x-ray is clear without evidence of pneumonia.  He does not report any shortness of breath.  He has had similar headaches with migraine type headaches a couple times a month.  He does not have any meningeal signs.  He has no hypoxia.  No ongoing vomiting.  His labs are nonconcerning other than some mild hypokalemia.  He was given 1 dose of fentanyl for his headache and acute on chronic neck pain.  He feels much better and is asking to be discharged.  He was discharged home in good condition.  He was given symptomatic care instructions and Covid precautions.  Return precautions were given.  [name] was evaluated in Emergency Department on 11/12/2019 for the symptoms described in the history of  present illness. He was evaluated in the context of the global  COVID-19 pandemic, which necessitated consideration that the patient might be at risk for infection with the SARS-CoV-2 virus that causes COVID-19. Institutional protocols and algorithms that pertain to the evaluation of patients at risk for COVID-19 are in a state of rapid change based on information released by regulatory bodies including the CDC and federal and state organizations. These policies and algorithms were followed during the patient's care in the ED.   Final Clinical Impressions(s) / ED Diagnoses   Final diagnoses:  Viral upper respiratory tract infection    ED Discharge Orders    None       Rolan BuccoBelfi, Tyriek Hofman, MD 11/12/19 2009

## 2019-11-12 NOTE — Discharge Instructions (Addendum)
Quarantine at home for at least the next 10 days.  You have to be fever free for at least 48 hours.  You can contact the hospital for your Covid results.  Return to the emergency room if you have any worsening symptoms including shortness of breath, worsening headache, ongoing vomiting or other worsening symptoms.

## 2019-11-14 ENCOUNTER — Emergency Department (HOSPITAL_COMMUNITY): Payer: Medicare Other

## 2019-11-14 ENCOUNTER — Emergency Department (HOSPITAL_COMMUNITY)
Admission: EM | Admit: 2019-11-14 | Discharge: 2019-11-15 | Disposition: A | Discharge status: Home / Self Care | Payer: Medicare Other | Attending: Emergency Medicine | Admitting: Emergency Medicine

## 2019-11-14 ENCOUNTER — Other Ambulatory Visit: Payer: Self-pay

## 2019-11-14 DIAGNOSIS — U071 COVID-19: Secondary | ICD-10-CM | POA: Insufficient documentation

## 2019-11-14 DIAGNOSIS — I10 Essential (primary) hypertension: Secondary | ICD-10-CM | POA: Diagnosis not present

## 2019-11-14 DIAGNOSIS — Z79899 Other long term (current) drug therapy: Secondary | ICD-10-CM | POA: Insufficient documentation

## 2019-11-14 DIAGNOSIS — G44209 Tension-type headache, unspecified, not intractable: Secondary | ICD-10-CM | POA: Insufficient documentation

## 2019-11-14 DIAGNOSIS — Z20828 Contact with and (suspected) exposure to other viral communicable diseases: Secondary | ICD-10-CM | POA: Diagnosis not present

## 2019-11-14 DIAGNOSIS — R05 Cough: Secondary | ICD-10-CM | POA: Diagnosis present

## 2019-11-14 DIAGNOSIS — Z20822 Contact with and (suspected) exposure to covid-19: Secondary | ICD-10-CM

## 2019-11-14 LAB — COMPREHENSIVE METABOLIC PANEL
ALT: 21 U/L (ref 0–44)
AST: 28 U/L (ref 15–41)
Albumin: 3.4 g/dL — ABNORMAL LOW (ref 3.5–5.0)
Alkaline Phosphatase: 66 U/L (ref 38–126)
Anion gap: 8 (ref 5–15)
BUN: 9 mg/dL (ref 8–23)
CO2: 23 mmol/L (ref 22–32)
Calcium: 8.4 mg/dL — ABNORMAL LOW (ref 8.9–10.3)
Chloride: 108 mmol/L (ref 98–111)
Creatinine, Ser: 0.84 mg/dL (ref 0.61–1.24)
GFR calc Af Amer: >60 mL/min (ref >60)
GFR calc non Af Amer: >60 mL/min (ref >60)
Glucose, Bld: 93 mg/dL (ref 70–99)
Potassium: 3.4 mmol/L — ABNORMAL LOW (ref 3.5–5.1)
Sodium: 139 mmol/L (ref 135–145)
Total Bilirubin: 0.8 mg/dL (ref 0.3–1.2)
Total Protein: 6.2 g/dL — ABNORMAL LOW (ref 6.5–8.1)

## 2019-11-14 LAB — URINALYSIS, ROUTINE W REFLEX MICROSCOPIC
Bilirubin Urine: NEGATIVE
Glucose, UA: NEGATIVE mg/dL
Hgb urine dipstick: NEGATIVE
Ketones, ur: NEGATIVE mg/dL
Leukocytes,Ua: NEGATIVE
Nitrite: NEGATIVE
Protein, ur: >=300 mg/dL — AB
Specific Gravity, Urine: 1.02 (ref 1.005–1.030)
pH: 5 (ref 5.0–8.0)

## 2019-11-14 LAB — CBC WITH DIFFERENTIAL/PLATELET
Abs Immature Granulocytes: 0.01 10*3/uL (ref 0.00–0.07)
Basophils Absolute: 0 10*3/uL (ref 0.0–0.1)
Basophils Relative: 0 %
Eosinophils Absolute: 0.2 10*3/uL (ref 0.0–0.5)
Eosinophils Relative: 6 %
HCT: 38.5 % — ABNORMAL LOW (ref 39.0–52.0)
Hemoglobin: 13 g/dL (ref 13.0–17.0)
Immature Granulocytes: 0 %
Lymphocytes Relative: 26 %
Lymphs Abs: 1.1 10*3/uL (ref 0.7–4.0)
MCH: 31 pg (ref 26.0–34.0)
MCHC: 33.8 g/dL (ref 30.0–36.0)
MCV: 91.9 fL (ref 80.0–100.0)
Monocytes Absolute: 0.6 10*3/uL (ref 0.1–1.0)
Monocytes Relative: 15 %
Neutro Abs: 2.2 10*3/uL (ref 1.7–7.7)
Neutrophils Relative %: 53 %
Platelets: 177 10*3/uL (ref 150–400)
RBC: 4.19 MIL/uL — ABNORMAL LOW (ref 4.22–5.81)
RDW: 13.5 % (ref 11.5–15.5)
WBC: 4.2 10*3/uL (ref 4.0–10.5)
nRBC: 0 % (ref 0.0–0.2)

## 2019-11-14 MED ORDER — ACETAMINOPHEN 500 MG PO TABS
1000.0000 mg | ORAL_TABLET | Freq: Once | ORAL | Status: AC
  Administered 2019-11-14: 16:00:00 1000 mg via ORAL
  Filled 2019-11-14: qty 2

## 2019-11-14 NOTE — ED Triage Notes (Signed)
Pt endorses cough, fever, ha, bodyaches x 1 week. Here 2 days ago and left after triage. VSS.

## 2019-11-15 LAB — SARS CORONAVIRUS 2 (TAT 6-24 HRS): SARS Coronavirus 2: POSITIVE — AB

## 2019-11-15 MED ORDER — DIPHENHYDRAMINE HCL 50 MG/ML IJ SOLN
25.0000 mg | Freq: Once | INTRAMUSCULAR | Status: AC
  Administered 2019-11-15: 02:00:00 25 mg via INTRAVENOUS
  Filled 2019-11-15: qty 1

## 2019-11-15 MED ORDER — SODIUM CHLORIDE 0.9 % IV BOLUS
1000.0000 mL | Freq: Once | INTRAVENOUS | Status: AC
  Administered 2019-11-15: 02:00:00 1000 mL via INTRAVENOUS

## 2019-11-15 MED ORDER — DEXAMETHASONE SODIUM PHOSPHATE 10 MG/ML IJ SOLN
10.0000 mg | Freq: Once | INTRAMUSCULAR | Status: AC
  Administered 2019-11-15: 02:00:00 10 mg via INTRAVENOUS
  Filled 2019-11-15: qty 1

## 2019-11-15 MED ORDER — KETOROLAC TROMETHAMINE 30 MG/ML IJ SOLN
15.0000 mg | Freq: Once | INTRAMUSCULAR | Status: AC
  Administered 2019-11-15: 02:00:00 15 mg via INTRAVENOUS
  Filled 2019-11-15: qty 1

## 2019-11-15 MED ORDER — METOCLOPRAMIDE HCL 10 MG PO TABS: 10.0000 mg | ORAL_TABLET | Freq: Four times a day (QID) | ORAL | 0 refills | Status: AC | PRN

## 2019-11-15 MED ORDER — METOCLOPRAMIDE HCL 5 MG/ML IJ SOLN
10.0000 mg | Freq: Once | INTRAMUSCULAR | Status: AC
  Administered 2019-11-15: 02:00:00 10 mg via INTRAVENOUS
  Filled 2019-11-15: qty 2

## 2019-11-15 MED ORDER — POTASSIUM CHLORIDE CRYS ER 20 MEQ PO TBCR
40.0000 meq | EXTENDED_RELEASE_TABLET | Freq: Once | ORAL | Status: AC
  Administered 2019-11-15: 02:00:00 40 meq via ORAL
  Filled 2019-11-15: qty 2

## 2019-11-15 NOTE — Discharge Instructions (Addendum)
Please check for the results for the test for COVID-19. It should be available in MyChart today or tomorrow. If it is positive, you will need to quarantine for two weeks.

## 2019-11-15 NOTE — ED Provider Notes (Signed)
[facility]   CSN: [id number] Arrival date & time: 11/14/19  1510    History   Chief Complaint Chief Complaint  Patient presents with  . Cough  . Generalized Body Aches  . Chills    HPI Iran PlanasRichard E Majid is a 73 y.o. male.   The history is provided by the patient.  Cough He has history of hypertension, hyperlipidemia and comes in because of a headache for the last 3 days.  He states that he has had cervical fusion has been told that he will be prone to headaches because of the surgery.  This headache is typical of his headaches.  It is a throbbing pain in the occiput with radiation to the frontal area with associated photophobia and phonophobia and slight blurring of vision.  He denies diplopia.  There has been no nausea or vomiting.  He denies weakness, numbness, tingling.  He was running fever several days ago, but not currently.  He denies chills or sweats.  He denies cough.  Denies change in sense of smell or taste.  He denies body aches.  He has not taken anything for his headache.  Pain is rated at 9/10.  He was seen in emergency department 2 days ago for respiratory infection and had COVID-19 test.  He does admit to exposure to a neighbor who tested positive for COVID-19.  Past Medical History:  Diagnosis Date  . ALLERGIC RHINITIS   . Anxiety and depression   . Back pain   . Confusion    vs. complex partial seizures  . Degenerative disc disease   . DJD (degenerative joint disease)    neck  . Hyperlipemia   . Hypertension   . Memory loss   . Seizures (HCC)   . Sleep apnea    failed cpap    Patient Active Problem List   Diagnosis Date Noted  . Memory loss 09/27/2015  . MCI (mild cognitive impairment) 09/27/2015  . Depression 09/27/2015  . Acute confusional state 09/26/2013  . Localization-related (focal) (partial) epilepsy and epileptic syndromes with complex partial seizures, without mention of intractable  epilepsy 09/26/2013  . HISTORY OF ASBESTOS EXPOSURE 08/22/2008  . OBSTRUCTIVE SLEEP APNEA 01/10/2008  . ALLERGIC RHINITIS 01/10/2008  . NASAL FRACTURE 01/10/2008  . BEHAVIOR PROBLEM 01/10/2008    Past Surgical History:  Procedure Laterality Date  . APPENDECTOMY    . CERVICAL SPINE SURGERY     rods, 3 other fusion procedures  . LUMBAR SPINE SURGERY    . ROTATOR CUFF REPAIR     bilateral  . THORACIC SPINE SURGERY    . TOTAL KNEE ARTHROPLASTY     right  . UVULOPALATOPHARYNGOPLASTY          Home Medications    Prior to Admission medications   Medication Sig Start Date End Date Taking? Authorizing Provider  ARIPiprazole (ABILIFY) 2 MG tablet TAKE 1-2 TABLETS ONCE A DAY ORALLY 30 DAY(S) 10/12/16   [provider]  clobetasol cream (TEMOVATE) 0.05 %  04/27/17   [provider]  doxycycline (VIBRAMYCIN) 100 MG capsule Take 1 capsule (100 mg total) by mouth 2 (two) times daily. 09/15/19   Liberty HandyGibbons, Claudia J, PA-C  escitalopram (LEXAPRO) 20 MG tablet 1 TABLET ONCE A DAY ORALLY 09/17/15   [provider]  finasteride (PROSCAR) 5 MG tablet Take 5 mg by mouth daily. 07/04/15   [provider]  fluocinonide (LIDEX) 0.05 % external solution  05/01/17   [provider]  folic acid (FOLVITE) 1 MG tablet Take 1 mg by mouth daily. 12/24/16   [provider]  gabapentin (NEURONTIN) 600 MG tablet Take 600 mg by mouth every 6 (six) hours. 04/02/17   [provider]  lisinopril-hydrochlorothiazide (PRINZIDE,ZESTORETIC) 20-12.5 MG tablet 2 TABLETS ONCE A DAY ORALLY 90 DAYS 10/26/16   [provider]  methotrexate (RHEUMATREX) 2.5 MG tablet 4 (four) times a week.  01/08/17   [provider]  metoprolol succinate (TOPROL-XL) 50 MG 24 hr tablet 1 TABLET ONCE A DAY ORALLY 30 DAY(S) 09/19/15   [provider]  OXcarbazepine (TRILEPTAL) 150 MG tablet Take 1 tablet (150 mg total) by mouth 2 (two) times daily. 01/13/18   Garvin Fila, MD  oxyCODONE-acetaminophen (PERCOCET) 10-325 MG per tablet  09/25/13   [provider]  oxymetazoline (AFRIN NASAL SPRAY) 0.05 % nasal spray 2 sprays by Nasal route 3 (three) times daily.      [provider]  simvastatin (ZOCOR) 40 MG tablet  09/26/15   [provider]  tamsulosin (FLOMAX) 0.4 MG CAPS capsule  09/08/13   [provider]  triamcinolone cream (KENALOG) 0.1 %  11/12/16   [provider]  venlafaxine XR (EFFEXOR-XR) 150 MG 24 hr capsule  12/07/16   [provider]    Family History Family History  Problem Relation Age of Onset  . Alcohol abuse Mother   . Alcohol abuse Father   . Arthritis Other   . Clotting disorder Other     Social History Social History   Tobacco Use  . Smoking status: Never Smoker  . Smokeless tobacco: Never Used  Substance Use Topics  . Alcohol use: No    Comment: Quit: 1990  . Drug use: No    Comment: Quit: 1990     Allergies   Patient has no known allergies.   Review of Systems Review of Systems  Respiratory: Positive for cough.   All other systems reviewed and are negative.    Physical Exam Updated Vital Signs BP (!) 156/85 (BP Location: Left Arm)   Pulse 100   Temp 98.4 F (36.9 C) (Oral)   Resp 18   SpO2 99%   Physical Exam Vitals signs and nursing note reviewed.    73 year old male, resting comfortably and in no acute distress. Vital signs are significant for elevated blood pressure. Oxygen saturation is 99%, which is normal. Head is normocephalic and atraumatic. PERRLA, EOMI. Oropharynx is clear. Neck is mildly tender in the insertion of the paracervical muscles.  There is no adenopathy or JVD. Back is nontender and there is no CVA tenderness. Lungs are clear without rales, wheezes, or rhonchi. Chest is nontender. Heart has regular rate and rhythm without murmur. Abdomen is soft, flat, nontender without masses or hepatosplenomegaly and peristalsis is  normoactive. Extremities have no cyanosis or edema, full range of motion is present. Skin is warm and dry without rash. Neurologic: Mental status is normal, cranial nerves are intact, there are no motor or sensory deficits.  ED Treatments / Results  Labs (all labs ordered are listed, but only abnormal results are displayed) Labs Reviewed  COMPREHENSIVE METABOLIC PANEL - Abnormal; Notable for the following components:      Result Value   Potassium 3.4 (*)    Calcium 8.4 (*)    Total Protein 6.2 (*)    Albumin 3.4 (*)    All other components within normal limits  CBC WITH DIFFERENTIAL/PLATELET - Abnormal;  Notable for the following components:   RBC 4.19 (*)    HCT 38.5 (*)    All other components within normal limits  URINALYSIS, ROUTINE W REFLEX MICROSCOPIC - Abnormal; Notable for the following components:   Protein, ur >=300 (*)    Bacteria, UA RARE (*)    All other components within normal limits  SARS CORONAVIRUS 2 (TAT 6-24 HRS)   Radiology Dg Chest 2 View  Result Date: 11/14/2019 CLINICAL DATA:  Cough, headache, chest pain, shortness of breath EXAM: CHEST - 2 VIEW COMPARISON:  11/12/2019 FINDINGS: Lungs are clear.  No pleural effusion or pneumothorax. The heart is normal in size. Degenerative changes of the visualized thoracolumbar spine. Cervical spine fixation hardware, incompletely visualized. IMPRESSION: Normal chest radiographs. Electronically Signed   By: Charline Bills M.D.   On: 11/14/2019 16:35    Procedures Procedures   Medications Ordered in ED Medications  sodium chloride 0.9 % bolus 1,000 mL (has no administration in time range)  ketorolac (TORADOL) 30 MG/ML injection 15 mg (has no administration in time range)  metoCLOPramide (REGLAN) injection 10 mg (has no administration in time range)  dexamethasone (DECADRON) injection 10 mg (has no administration in time range)  acetaminophen (TYLENOL) tablet 1,000 mg (1,000 mg Oral Given 11/14/19 1612)      Initial Impression / Assessment and Plan / ED Course  I have reviewed the triage vital signs and the nursing notes.  Pertinent labs & imaging results that were available during my care of the patient were reviewed by me and considered in my medical decision making (see chart for details).  Headache which seems most likely to be muscle contraction headache.  No red flags to suggest more serious pathology such as subarachnoid hemorrhage or meningitis.  Old records are reviewed, and he was seen in the emergency department 2 days ago for upper respiratory infection and did have a test for COVID-19, but I cannot find any results for that test.  He will be given a headache cocktail of normal saline, metoclopramide, diphenhydramine, ketorolac, dexamethasone.  Labs today show mild hypokalemia and is given a dose of oral potassium.  Chest x-ray shows no evidence of pneumonia.  He had excellent relief of headache with above-noted treatment, and is discharged with prescription for metoclopramide.  Advised to check for results of COVID-19 screen and self quarantine if positive.  ALPHEUS STIFF was evaluated in Emergency Department on 11/15/2019 for the symptoms described in the history of present illness. He was evaluated in the context of the global COVID-19 pandemic, which necessitated consideration that the patient might be at risk for infection with the SARS-CoV-2 virus that causes COVID-19. Institutional protocols and algorithms that pertain to the evaluation of patients at risk for COVID-19 are in a state of rapid change based on information released by regulatory bodies including the CDC and federal and state organizations. These policies and algorithms were followed during the patient's care in the ED.  Final Clinical Impressions(s) / ED Diagnoses   Final diagnoses:  Muscle contraction headache  Exposure to COVID-19 virus    ED Discharge Orders         Ordered    metoCLOPramide (REGLAN) 10 MG  tablet  Every 6 hours PRN     11/15/19 0318           Dione Booze, MD 11/15/19 0321

## 2019-11-29 DEATH — deceased
